# Patient Record
Sex: Female | Born: 1935 | Hispanic: No | Smoking: Never smoker
Health system: Southern US, Community
[De-identification: ages and names within clinical notes are randomized; demographics above are authoritative.]

## PROBLEM LIST (undated history)

## (undated) DIAGNOSIS — I10 Essential (primary) hypertension: Secondary | ICD-10-CM

## (undated) DIAGNOSIS — C189 Malignant neoplasm of colon, unspecified: Secondary | ICD-10-CM

## (undated) DIAGNOSIS — R55 Syncope and collapse: Secondary | ICD-10-CM

## (undated) DIAGNOSIS — D649 Anemia, unspecified: Secondary | ICD-10-CM

## (undated) DIAGNOSIS — E785 Hyperlipidemia, unspecified: Secondary | ICD-10-CM

## (undated) DIAGNOSIS — K449 Diaphragmatic hernia without obstruction or gangrene: Secondary | ICD-10-CM

## (undated) DIAGNOSIS — E079 Disorder of thyroid, unspecified: Secondary | ICD-10-CM

## (undated) DIAGNOSIS — K219 Gastro-esophageal reflux disease without esophagitis: Secondary | ICD-10-CM

## (undated) HISTORY — PX: COLON SURGERY: SHX602

## (undated) HISTORY — DX: Syncope and collapse: R55

---

## 1998-05-14 ENCOUNTER — Other Ambulatory Visit: Admission: RE | Admit: 1998-05-14 | Discharge: 1998-05-14 | Payer: Self-pay | Admitting: Internal Medicine

## 2000-04-12 ENCOUNTER — Other Ambulatory Visit: Admission: RE | Admit: 2000-04-12 | Discharge: 2000-04-12 | Payer: Self-pay | Admitting: Obstetrics and Gynecology

## 2001-05-08 ENCOUNTER — Other Ambulatory Visit: Admission: RE | Admit: 2001-05-08 | Discharge: 2001-05-08 | Payer: Self-pay | Admitting: Obstetrics and Gynecology

## 2004-08-26 ENCOUNTER — Ambulatory Visit: Payer: Self-pay | Admitting: Internal Medicine

## 2004-08-31 ENCOUNTER — Ambulatory Visit: Payer: Self-pay | Admitting: Internal Medicine

## 2006-08-29 ENCOUNTER — Ambulatory Visit: Payer: Self-pay | Admitting: Internal Medicine

## 2006-08-31 LAB — CBC WITH DIFFERENTIAL/PLATELET
Basophils Absolute: 0.1 10*3/uL (ref 0.0–0.1)
EOS%: 2.2 % (ref 0.0–7.0)
Eosinophils Absolute: 0.1 10*3/uL (ref 0.0–0.5)
HCT: 26.4 % — ABNORMAL LOW (ref 34.8–46.6)
HGB: 8.3 g/dL — ABNORMAL LOW (ref 11.6–15.9)
MCH: 21 pg — ABNORMAL LOW (ref 26.0–34.0)
MCV: 67.3 fL — ABNORMAL LOW (ref 81.0–101.0)
MONO%: 11.8 % (ref 0.0–13.0)
NEUT#: 3.5 10*3/uL (ref 1.5–6.5)
NEUT%: 61.4 % (ref 39.6–76.8)
RDW: 22.5 % — ABNORMAL HIGH (ref 11.3–14.5)

## 2006-09-01 LAB — PROTEIN ELECTROPHORESIS, SERUM
Gamma Globulin: 12.8 % (ref 11.1–18.8)
Total Protein, Serum Electrophoresis: 7.6 g/dL (ref 6.0–8.3)

## 2006-09-01 LAB — COMPREHENSIVE METABOLIC PANEL
AST: 17 U/L (ref 0–37)
Albumin: 4.3 g/dL (ref 3.5–5.2)
Alkaline Phosphatase: 86 U/L (ref 39–117)
BUN: 24 mg/dL — ABNORMAL HIGH (ref 6–23)
Calcium: 9.6 mg/dL (ref 8.4–10.5)
Chloride: 100 mEq/L (ref 96–112)
Creatinine, Ser: 1.16 mg/dL (ref 0.40–1.20)
Glucose, Bld: 96 mg/dL (ref 70–99)
Potassium: 4.2 mEq/L (ref 3.5–5.3)

## 2006-09-01 LAB — IRON AND TIBC
%SAT: 4 % — ABNORMAL LOW (ref 20–55)
TIBC: 552 ug/dL — ABNORMAL HIGH (ref 250–470)
UIBC: 530 ug/dL

## 2006-09-01 LAB — FOLATE RBC: RBC Folate: 1209 ng/mL — ABNORMAL HIGH (ref 180–600)

## 2006-09-01 LAB — FERRITIN: Ferritin: 3 ng/mL — ABNORMAL LOW (ref 10–291)

## 2006-09-01 LAB — VITAMIN B12: Vitamin B-12: 548 pg/mL (ref 211–911)

## 2006-10-16 ENCOUNTER — Ambulatory Visit: Payer: Self-pay | Admitting: Internal Medicine

## 2006-10-19 LAB — CBC WITH DIFFERENTIAL/PLATELET
BASO%: 2.1 % — ABNORMAL HIGH (ref 0.0–2.0)
EOS%: 2.1 % (ref 0.0–7.0)
HCT: 36 % (ref 34.8–46.6)
LYMPH%: 28.7 % (ref 14.0–48.0)
MCH: 27.9 pg (ref 26.0–34.0)
MCHC: 33.7 g/dL (ref 32.0–36.0)
MONO#: 0.4 10*3/uL (ref 0.1–0.9)
NEUT%: 60.2 % (ref 39.6–76.8)
RBC: 4.35 10*6/uL (ref 3.70–5.32)
WBC: 5.2 10*3/uL (ref 3.9–10.0)
lymph#: 1.5 10*3/uL (ref 0.9–3.3)

## 2006-10-19 LAB — FERRITIN: Ferritin: 387 ng/mL — ABNORMAL HIGH (ref 10–291)

## 2006-10-19 LAB — IRON AND TIBC
%SAT: 22 % (ref 20–55)
Iron: 71 ug/dL (ref 42–145)
TIBC: 323 ug/dL (ref 250–470)

## 2007-01-16 ENCOUNTER — Ambulatory Visit: Payer: Self-pay | Admitting: Internal Medicine

## 2007-01-18 LAB — CBC WITH DIFFERENTIAL/PLATELET
BASO%: 1 % (ref 0.0–2.0)
Basophils Absolute: 0.1 10*3/uL (ref 0.0–0.1)
HCT: 36 % (ref 34.8–46.6)
HGB: 12.4 g/dL (ref 11.6–15.9)
MONO#: 0.5 10*3/uL (ref 0.1–0.9)
NEUT%: 58.7 % (ref 39.6–76.8)
RDW: 13.6 % (ref 11.3–14.5)
WBC: 5.3 10*3/uL (ref 3.9–10.0)
lymph#: 1.5 10*3/uL (ref 0.9–3.3)

## 2007-01-18 LAB — IRON AND TIBC
%SAT: 27 % (ref 20–55)
TIBC: 303 ug/dL (ref 250–470)
UIBC: 221 ug/dL

## 2009-08-14 ENCOUNTER — Encounter (INDEPENDENT_AMBULATORY_CARE_PROVIDER_SITE_OTHER): Payer: Self-pay | Admitting: *Deleted

## 2010-01-14 ENCOUNTER — Encounter (INDEPENDENT_AMBULATORY_CARE_PROVIDER_SITE_OTHER): Payer: Self-pay | Admitting: *Deleted

## 2010-03-29 ENCOUNTER — Encounter (INDEPENDENT_AMBULATORY_CARE_PROVIDER_SITE_OTHER): Payer: Self-pay | Admitting: *Deleted

## 2010-03-31 ENCOUNTER — Ambulatory Visit: Payer: Self-pay | Admitting: Internal Medicine

## 2010-04-12 ENCOUNTER — Ambulatory Visit: Payer: Self-pay | Admitting: Internal Medicine

## 2010-08-17 NOTE — Letter (Signed)
Summary: [facility] Instructions  [facility]  [address] [place], [place] [address]   Phone: [phone]  Fax: [phone]       Allison Knight    [birth year]    MRN: [id number]       Procedure Day /Date: Monday 04-12-10     Arrival Time:  8:30 a.m.     Procedure Time: 9:30 a.m.     Location of Procedure:                    _x _   [facility] Center (4th Floor)  PREPARATION FOR COLONOSCOPY WITH MIRALAX  Starting 5 days prior to your procedure  04-07-10 do not eat nuts, seeds, popcorn, corn, beans, peas,  salads, or any raw vegetables.  Do not take any fiber supplements (e.g. Metamucil, Citrucel, and Benefiber). ____________________________________________________________________________________________________   THE DAY BEFORE YOUR PROCEDURE         DATE:  04-11-10  DAY:  Sunday  1   Drink clear liquids the entire day-NO SOLID FOOD  2   Do not drink anything colored red or purple.  Avoid juices with pulp.  No orange juice.  3   Drink at least 64 oz. (8 glasses) of fluid/clear liquids during the day to prevent dehydration and help the prep work efficiently.  CLEAR LIQUIDS INCLUDE: Water Jello Ice Popsicles Tea (sugar ok, no milk/cream) Powdered fruit flavored drinks Coffee (sugar ok, no milk/cream) Gatorade Juice: apple, white grape, white cranberry  Lemonade Clear bullion, consomm, broth Carbonated beverages (any kind) Strained chicken noodle soup Hard Candy  4   Mix the entire bottle of Miralax with 64 oz. of Gatorade/Powerade in the morning and put in the refrigerator to chill.  5   At 3:00 pm take 2 Dulcolax/Bisacodyl tablets.  6   At 4:30 pm take one Reglan/Metoclopramide tablet.  7  Starting at 5:00 pm drink one 8 oz glass of the Miralax mixture every 15-20 minutes until you have finished drinking the entire 64 oz.  You should finish drinking prep around 7:30 or 8:00 pm.  8   If you are nauseated, you may take the 2nd Reglan/Metoclopramide  tablet at 6:30 pm.        9    At 8:00 pm take 2 more DULCOLAX/Bisacodyl tablets.     THE DAY OF YOUR PROCEDURE      DATE:   04-12-10  DAY:  Monday  You may drink clear liquids until  7:30 a.m.  (2 HOURS BEFORE PROCEDURE).   MEDICATION INSTRUCTIONS  Unless otherwise instructed, you should take regular prescription medications with a small sip of water as early as possible the morning of your procedure.          OTHER INSTRUCTIONS  You will need a responsible adult at least 73 years of age to accompany you and drive you home.   This person must remain in the waiting room during your procedure.  Wear loose fitting clothing that is easily removed.  Leave jewelry and other valuables at home.  However, you may wish to bring a book to read or an iPod/MP3 player to listen to music as you wait for your procedure to start.  Remove all body piercing jewelry and leave at home.  Total time from sign-in until discharge is approximately 2-3 hours.  You should go home directly after your procedure and rest.  You can resume normal activities the day after your procedure.  The day of your procedure you  should not:   Drive   Make legal decisions   Operate machinery   Drink alcohol   Return to work  You will receive specific instructions about eating, activities and medications before you leave.   The above instructions have been reviewed and explained to me by   Durwin Glaze RN  March 31, 2010 10:28 AM    I fully understand and can verbalize these instructions _____________________________ Date _______

## 2010-08-17 NOTE — Miscellaneous (Signed)
Summary: [facility]  Clinical Lists Changes  Medications: Added new medication of MIRALAX   POWD (POLYETHYLENE GLYCOL 3350) As per prep  instructions. - Signed Added new medication of DULCOLAX 5 MG  TBEC (BISACODYL) Day before procedure take 2 at 3pm and 2 at 8pm. - Signed Added new medication of REGLAN 10 MG  TABS (METOCLOPRAMIDE HCL) As per prep instructions. - Signed Rx of MIRALAX   POWD (POLYETHYLENE GLYCOL 3350) As per prep  instructions.;  #255gm x 0;  Signed;  Entered by: Durwin Glaze RN;  Authorized by: Hart Carwin MD;  Method used: Electronically to [facility] [phone]*, [address] [place], [place]  [address], Ph: [phone], Fax: [phone] Rx of DULCOLAX 5 MG  TBEC (BISACODYL) Day before procedure take 2 at 3pm and 2 at 8pm.;  #4 x 0;  Signed;  Entered by: Durwin Glaze RN;  Authorized by: Hart Carwin MD;  Method used: Electronically to [facility] [phone]*, [address], [place], [place]  [address], Ph: [phone], Fax: [phone] Rx of REGLAN 10 MG  TABS (METOCLOPRAMIDE HCL) As per prep instructions.;  #2 x 0;  Signed;  Entered by: Durwin Glaze RN;  Authorized by: Hart Carwin MD;  Method used: Electronically to [facility] [phone]*, [address], [place], [place]  [address], Ph: [phone], Fax: [phone] Observations: Added new observation of NKA: T (03/31/2010 9:45)    Prescriptions: REGLAN 10 MG  TABS (METOCLOPRAMIDE HCL) As per prep instructions.  #2 x 0   Entered by:   Durwin Glaze RN   Authorized by:   Hart Carwin MD   Signed by:   Durwin Glaze RN on 03/31/2010   Method used:   Electronically to        [facility] [phone]* (retail)       [address]       [place], [place]  [address]       Ph: [phone]       Fax: [phone]   RxID:   [id number] DULCOLAX 5 MG  TBEC (BISACODYL) Day before procedure take 2 at 3pm and 2 at 8pm.  #4 x 0   Entered by:   Durwin Glaze RN   Authorized by:   Hart Carwin MD   Signed by:   Durwin Glaze RN on 03/31/2010  Method used:   Electronically to        [facility] [phone]* (retail)       [address]       [place], [place]  [address]       Ph: [phone]       Fax: [phone]   RxID:   [id number] MIRALAX   POWD (POLYETHYLENE GLYCOL 3350) As per prep  instructions.  #255gm x 0   Entered by:   Durwin Glaze RN   Authorized by:   Hart Carwin MD   Signed by:   Durwin Glaze RN on 03/31/2010   Method used:   Electronically to        [facility] [phone]* (retail)       [address]       [place], [place]  [address]       Ph: [phone]       Fax: [phone]   RxID:   [id number]

## 2010-08-17 NOTE — Letter (Signed)
Summary: Previsit letter  [facility]  [address] [place], [place] [address]   Phone: [phone]  Fax: [phone]       01/14/2010 MRN: [id number]  [facility] [address] [place], [place]  [address]  Dear Ms. Allison Knight,  Welcome to the Gastroenterology Division at [facility].    You are scheduled to see a nurse for your pre-procedure visit on 03-31-10 at 10:00a.m. on the 3rd floor at [facility], [address]  We ask that you try to arrive at our office 15 minutes prior to your appointment time to allow for check-in.  Your nurse visit will consist of discussing your medical and surgical history, your immediate family medical history, and your medications.    Please bring a complete list of all your medications or, if you prefer, bring the medication bottles and we will list them.  We will need to be aware of both prescribed and over the counter drugs.  We will need to know exact dosage information as well.  If you are on blood thinners (Coumadin, Plavix, Aggrenox, Ticlid, etc.) please call our office today/prior to your appointment, as we need to consult with your physician about holding your medication.   Please be prepared to read and sign documents such as consent forms, a financial agreement, and acknowledgement forms.  If necessary, and with your consent, a friend or relative is welcome to sit-in on the nurse visit with you.  Please bring your insurance card so that we may make a copy of it.  If your insurance requires a referral to see a specialist, please bring your referral form from your primary care physician.  No co-pay is required for this nurse visit.     If you cannot keep your appointment, please call [phone] to cancel or reschedule prior to your appointment date.  This allows [place] the opportunity to schedule an appointment for another patient in need of care.    Thank you for choosing [facility] for your medical  needs.  We appreciate the opportunity to care for you.  Please visit [place] at our website  to learn more about our practice.                     Sincerely.                                                                                                                   The Gastroenterology Division

## 2010-08-17 NOTE — Letter (Signed)
Summary: Colonoscopy Letter   Gastroenterology  [address] [place], [place] [address]   Phone: [phone]  Fax: [phone]      August 14, 2009 MRN: [id number]   CHLOIE LONEY [address] [place], [place]  [address]   Dear Ms. Donnie Aho,   According to your medical record, it is time for you to schedule a Colonoscopy. The American Cancer Society recommends this procedure as a method to detect early colon cancer. Patients with a family history of colon cancer, or a personal history of colon polyps or inflammatory bowel disease are at increased risk.  This letter has beeen generated based on the recommendations made at the time of your procedure. If you feel that in your particular situation this may no longer apply, please contact our office.  Please call our office at [phone] to schedule this appointment or to update your records at your earliest convenience.  Thank you for cooperating with [place] to provide you with the very best care possible.   Sincerely,  Hedwig Morton. Juanda Chance, M.D.  [facility] Gastroenterology Division [phone]

## 2010-08-17 NOTE — Procedures (Signed)
Summary: Colonoscopy  Patient: Allison Knight Note: All result statuses are Final unless otherwise noted.  Tests: (1) Colonoscopy (COL)   COL Colonoscopy           DONE     [facility]     [address]     [place], [place]  [address]           COLONOSCOPY PROCEDURE REPORT           PATIENT:  Allison Knight, Allison Knight  MR#:  [id number]     BIRTHDATE:  [birth year], 74 yrs. old  GENDER:  female     ENDOSCOPIST:  Hedwig Morton. Juanda Chance, MD     REF. BY:  Elias Else, M.D.     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  Digital rectal exam was performed and     revealed no rectal masses.   The LB [id number]-[id number] endoscope     was introduced through the anus and advanced to the cecum, which     was identified by both the appendix and ileocecal valve, without     limitations.  The quality of the prep was good, using MiraLax.     The instrument was then slowly withdrawn as the colon was fully     examined.     <<PROCEDUREIMAGES>>           FINDINGS:  A postoperative change was noted (see image7). s/p     anterior resection, anastomosis in the sigmoid colon at 10 cm,     wide open, no stricture  Mild diverticulosis was found in the     sigmoid colon (see image5).  other finding (see image1, image2,     image3, image4, and image6).   Retroflexed views in the rectum     revealed no abnormalities.    The scope was then withdrawn from     the patient and the procedure completed.           COMPLICATIONS:  None     ENDOSCOPIC IMPRESSION:     1) Postop change     2) Mild diverticulosis in the sigmoid colon     3)  Other finding     s/p anterior resection for DukeIIB colon cancer in 1991, no     evidence of recurrent ca     RECOMMENDATIONS:     1) high fiber diet     REPEAT EXAM:  In 5 year(s) for.           ______________________________     Hedwig Morton. Juanda Chance, MD           CC:           n.     eSIGNED:   Hedwig Morton. Brodie at 04/12/2010 10:20 AM           Mickie Hillier, [id number]  Note: An exclamation mark (!) indicates a result that was not dispersed into the flowsheet. Document Creation Date:  04/12/2010 10:21 AM _______________________________________________________________________  (1) Order result status: Final Collection or observation date-time: 04/12/2010 10:11 Requested date-time:  Receipt date-time:  Reported date-time:  Referring Physician:   Ordering Physician: Lina Sar [phone]) Specimen Source:  Source: Launa Grill Order Number: [id number] Lab site:   Appended Document: Colonoscopy    Clinical Lists Changes  Observations: Added new observation of COLONNXTDUE: 03/2015 (04/12/2010 12:51)

## 2011-07-20 DIAGNOSIS — J309 Allergic rhinitis, unspecified: Secondary | ICD-10-CM | POA: Diagnosis not present

## 2011-08-01 DIAGNOSIS — T63461A Toxic effect of venom of wasps, accidental (unintentional), initial encounter: Secondary | ICD-10-CM | POA: Diagnosis not present

## 2011-08-01 DIAGNOSIS — T6391XA Toxic effect of contact with unspecified venomous animal, accidental (unintentional), initial encounter: Secondary | ICD-10-CM | POA: Diagnosis not present

## 2011-08-15 DIAGNOSIS — J309 Allergic rhinitis, unspecified: Secondary | ICD-10-CM | POA: Diagnosis not present

## 2011-09-02 DIAGNOSIS — J309 Allergic rhinitis, unspecified: Secondary | ICD-10-CM | POA: Diagnosis not present

## 2011-09-16 DIAGNOSIS — J3089 Other allergic rhinitis: Secondary | ICD-10-CM | POA: Diagnosis not present

## 2011-09-16 DIAGNOSIS — J309 Allergic rhinitis, unspecified: Secondary | ICD-10-CM | POA: Diagnosis not present

## 2011-10-20 DIAGNOSIS — J309 Allergic rhinitis, unspecified: Secondary | ICD-10-CM | POA: Diagnosis not present

## 2011-11-03 DIAGNOSIS — J309 Allergic rhinitis, unspecified: Secondary | ICD-10-CM | POA: Diagnosis not present

## 2011-11-21 DIAGNOSIS — I10 Essential (primary) hypertension: Secondary | ICD-10-CM | POA: Diagnosis not present

## 2011-11-21 DIAGNOSIS — E785 Hyperlipidemia, unspecified: Secondary | ICD-10-CM | POA: Diagnosis not present

## 2011-11-21 DIAGNOSIS — K219 Gastro-esophageal reflux disease without esophagitis: Secondary | ICD-10-CM | POA: Diagnosis not present

## 2011-11-21 DIAGNOSIS — D509 Iron deficiency anemia, unspecified: Secondary | ICD-10-CM | POA: Diagnosis not present

## 2011-12-05 DIAGNOSIS — J309 Allergic rhinitis, unspecified: Secondary | ICD-10-CM | POA: Diagnosis not present

## 2011-12-30 DIAGNOSIS — J309 Allergic rhinitis, unspecified: Secondary | ICD-10-CM | POA: Diagnosis not present

## 2012-01-16 DIAGNOSIS — IMO0002 Reserved for concepts with insufficient information to code with codable children: Secondary | ICD-10-CM | POA: Diagnosis not present

## 2012-02-09 DIAGNOSIS — J309 Allergic rhinitis, unspecified: Secondary | ICD-10-CM | POA: Diagnosis not present

## 2012-03-09 DIAGNOSIS — J309 Allergic rhinitis, unspecified: Secondary | ICD-10-CM | POA: Diagnosis not present

## 2012-04-11 DIAGNOSIS — J309 Allergic rhinitis, unspecified: Secondary | ICD-10-CM | POA: Diagnosis not present

## 2012-05-07 DIAGNOSIS — T6391XA Toxic effect of contact with unspecified venomous animal, accidental (unintentional), initial encounter: Secondary | ICD-10-CM | POA: Diagnosis not present

## 2012-05-07 DIAGNOSIS — T63461A Toxic effect of venom of wasps, accidental (unintentional), initial encounter: Secondary | ICD-10-CM | POA: Diagnosis not present

## 2012-05-07 DIAGNOSIS — J309 Allergic rhinitis, unspecified: Secondary | ICD-10-CM | POA: Diagnosis not present

## 2012-05-10 DIAGNOSIS — D649 Anemia, unspecified: Secondary | ICD-10-CM | POA: Diagnosis not present

## 2012-05-10 DIAGNOSIS — N183 Chronic kidney disease, stage 3 unspecified: Secondary | ICD-10-CM | POA: Diagnosis not present

## 2012-05-11 DIAGNOSIS — Z23 Encounter for immunization: Secondary | ICD-10-CM | POA: Diagnosis not present

## 2012-05-25 ENCOUNTER — Other Ambulatory Visit (HOSPITAL_COMMUNITY): Payer: Self-pay | Admitting: *Deleted

## 2012-05-28 ENCOUNTER — Encounter (HOSPITAL_COMMUNITY)
Admission: RE | Admit: 2012-05-28 | Discharge: 2012-05-28 | Disposition: A | Discharge status: Home / Self Care | Payer: Medicare Other | Source: Ambulatory Visit | Attending: Nephrology | Admitting: Nephrology

## 2012-05-28 DIAGNOSIS — D509 Iron deficiency anemia, unspecified: Secondary | ICD-10-CM | POA: Insufficient documentation

## 2012-05-28 MED ORDER — FERUMOXYTOL INJECTION 510 MG/17 ML
INTRAVENOUS | Status: AC
  Administered 2012-05-28: 510 mg via INTRAVENOUS
  Filled 2012-05-28: qty 17

## 2012-05-28 MED ORDER — SODIUM CHLORIDE 0.9 % IV SOLN
INTRAVENOUS | Status: DC
  Administered 2012-05-28: 250 mL via INTRAVENOUS

## 2012-05-28 MED ORDER — FERUMOXYTOL INJECTION 510 MG/17 ML
510.0000 mg | INTRAVENOUS | Status: DC
  Administered 2012-05-28: 510 mg via INTRAVENOUS

## 2012-05-31 DIAGNOSIS — J309 Allergic rhinitis, unspecified: Secondary | ICD-10-CM | POA: Diagnosis not present

## 2012-06-04 ENCOUNTER — Encounter (HOSPITAL_COMMUNITY)
Admission: RE | Admit: 2012-06-04 | Discharge: 2012-06-04 | Disposition: A | Discharge status: Home / Self Care | Payer: Medicare Other | Source: Ambulatory Visit | Attending: Nephrology | Admitting: Nephrology

## 2012-06-04 MED ORDER — FERUMOXYTOL INJECTION 510 MG/17 ML
510.0000 mg | INTRAVENOUS | Status: AC
  Administered 2012-06-04: 510 mg via INTRAVENOUS

## 2012-06-04 MED ORDER — FERUMOXYTOL INJECTION 510 MG/17 ML
INTRAVENOUS | Status: AC
  Administered 2012-06-04: 510 mg via INTRAVENOUS
  Filled 2012-06-04: qty 17

## 2012-06-04 MED ORDER — SODIUM CHLORIDE 0.9 % IV SOLN
INTRAVENOUS | Status: AC
  Administered 2012-06-04: 250 mL via INTRAVENOUS

## 2012-06-08 DIAGNOSIS — J309 Allergic rhinitis, unspecified: Secondary | ICD-10-CM | POA: Diagnosis not present

## 2012-06-20 DIAGNOSIS — I10 Essential (primary) hypertension: Secondary | ICD-10-CM | POA: Diagnosis not present

## 2012-06-20 DIAGNOSIS — N183 Chronic kidney disease, stage 3 unspecified: Secondary | ICD-10-CM | POA: Diagnosis not present

## 2012-06-20 DIAGNOSIS — E785 Hyperlipidemia, unspecified: Secondary | ICD-10-CM | POA: Diagnosis not present

## 2012-06-20 DIAGNOSIS — K219 Gastro-esophageal reflux disease without esophagitis: Secondary | ICD-10-CM | POA: Diagnosis not present

## 2012-06-20 DIAGNOSIS — D509 Iron deficiency anemia, unspecified: Secondary | ICD-10-CM | POA: Diagnosis not present

## 2012-06-20 DIAGNOSIS — Z0000 Encounter for general adult medical examination without abnormal findings: Secondary | ICD-10-CM | POA: Diagnosis not present

## 2012-06-22 DIAGNOSIS — N390 Urinary tract infection, site not specified: Secondary | ICD-10-CM | POA: Diagnosis not present

## 2012-06-26 DIAGNOSIS — E871 Hypo-osmolality and hyponatremia: Secondary | ICD-10-CM | POA: Diagnosis not present

## 2012-06-26 DIAGNOSIS — D509 Iron deficiency anemia, unspecified: Secondary | ICD-10-CM | POA: Diagnosis not present

## 2012-07-09 DIAGNOSIS — J309 Allergic rhinitis, unspecified: Secondary | ICD-10-CM | POA: Diagnosis not present

## 2012-08-17 DIAGNOSIS — T63461A Toxic effect of venom of wasps, accidental (unintentional), initial encounter: Secondary | ICD-10-CM | POA: Diagnosis not present

## 2012-08-17 DIAGNOSIS — J309 Allergic rhinitis, unspecified: Secondary | ICD-10-CM | POA: Diagnosis not present

## 2012-08-17 DIAGNOSIS — T6391XA Toxic effect of contact with unspecified venomous animal, accidental (unintentional), initial encounter: Secondary | ICD-10-CM | POA: Diagnosis not present

## 2012-09-07 DIAGNOSIS — T63461A Toxic effect of venom of wasps, accidental (unintentional), initial encounter: Secondary | ICD-10-CM | POA: Diagnosis not present

## 2012-09-07 DIAGNOSIS — J309 Allergic rhinitis, unspecified: Secondary | ICD-10-CM | POA: Diagnosis not present

## 2012-09-07 DIAGNOSIS — T6391XA Toxic effect of contact with unspecified venomous animal, accidental (unintentional), initial encounter: Secondary | ICD-10-CM | POA: Diagnosis not present

## 2012-09-26 DIAGNOSIS — J309 Allergic rhinitis, unspecified: Secondary | ICD-10-CM | POA: Diagnosis not present

## 2012-09-28 DIAGNOSIS — J309 Allergic rhinitis, unspecified: Secondary | ICD-10-CM | POA: Diagnosis not present

## 2012-10-04 DIAGNOSIS — J3089 Other allergic rhinitis: Secondary | ICD-10-CM | POA: Diagnosis not present

## 2012-10-04 DIAGNOSIS — J301 Allergic rhinitis due to pollen: Secondary | ICD-10-CM | POA: Diagnosis not present

## 2012-11-06 DIAGNOSIS — J309 Allergic rhinitis, unspecified: Secondary | ICD-10-CM | POA: Diagnosis not present

## 2012-11-14 DIAGNOSIS — Z1231 Encounter for screening mammogram for malignant neoplasm of breast: Secondary | ICD-10-CM | POA: Diagnosis not present

## 2012-12-14 DIAGNOSIS — J309 Allergic rhinitis, unspecified: Secondary | ICD-10-CM | POA: Diagnosis not present

## 2012-12-17 DIAGNOSIS — N951 Menopausal and female climacteric states: Secondary | ICD-10-CM | POA: Diagnosis not present

## 2012-12-17 DIAGNOSIS — N183 Chronic kidney disease, stage 3 unspecified: Secondary | ICD-10-CM | POA: Diagnosis not present

## 2012-12-17 DIAGNOSIS — Z23 Encounter for immunization: Secondary | ICD-10-CM | POA: Diagnosis not present

## 2012-12-17 DIAGNOSIS — I10 Essential (primary) hypertension: Secondary | ICD-10-CM | POA: Diagnosis not present

## 2012-12-17 DIAGNOSIS — E039 Hypothyroidism, unspecified: Secondary | ICD-10-CM | POA: Diagnosis not present

## 2012-12-17 DIAGNOSIS — E785 Hyperlipidemia, unspecified: Secondary | ICD-10-CM | POA: Diagnosis not present

## 2012-12-17 DIAGNOSIS — K219 Gastro-esophageal reflux disease without esophagitis: Secondary | ICD-10-CM | POA: Diagnosis not present

## 2013-01-15 DIAGNOSIS — J309 Allergic rhinitis, unspecified: Secondary | ICD-10-CM | POA: Diagnosis not present

## 2013-01-15 DIAGNOSIS — T6391XA Toxic effect of contact with unspecified venomous animal, accidental (unintentional), initial encounter: Secondary | ICD-10-CM | POA: Diagnosis not present

## 2013-01-30 DIAGNOSIS — M899 Disorder of bone, unspecified: Secondary | ICD-10-CM | POA: Diagnosis not present

## 2013-01-30 DIAGNOSIS — N951 Menopausal and female climacteric states: Secondary | ICD-10-CM | POA: Diagnosis not present

## 2013-02-18 DIAGNOSIS — Z961 Presence of intraocular lens: Secondary | ICD-10-CM | POA: Diagnosis not present

## 2013-02-18 DIAGNOSIS — H2640 Unspecified secondary cataract: Secondary | ICD-10-CM | POA: Diagnosis not present

## 2013-02-18 DIAGNOSIS — H43819 Vitreous degeneration, unspecified eye: Secondary | ICD-10-CM | POA: Diagnosis not present

## 2013-02-18 DIAGNOSIS — H01009 Unspecified blepharitis unspecified eye, unspecified eyelid: Secondary | ICD-10-CM | POA: Diagnosis not present

## 2013-02-18 DIAGNOSIS — J309 Allergic rhinitis, unspecified: Secondary | ICD-10-CM | POA: Diagnosis not present

## 2013-03-25 DIAGNOSIS — J309 Allergic rhinitis, unspecified: Secondary | ICD-10-CM | POA: Diagnosis not present

## 2013-04-09 DIAGNOSIS — Z23 Encounter for immunization: Secondary | ICD-10-CM | POA: Diagnosis not present

## 2013-04-29 DIAGNOSIS — J309 Allergic rhinitis, unspecified: Secondary | ICD-10-CM | POA: Diagnosis not present

## 2013-04-29 DIAGNOSIS — T63461A Toxic effect of venom of wasps, accidental (unintentional), initial encounter: Secondary | ICD-10-CM | POA: Diagnosis not present

## 2013-04-29 DIAGNOSIS — T6391XA Toxic effect of contact with unspecified venomous animal, accidental (unintentional), initial encounter: Secondary | ICD-10-CM | POA: Diagnosis not present

## 2013-05-31 DIAGNOSIS — J309 Allergic rhinitis, unspecified: Secondary | ICD-10-CM | POA: Diagnosis not present

## 2013-06-24 DIAGNOSIS — I10 Essential (primary) hypertension: Secondary | ICD-10-CM | POA: Diagnosis not present

## 2013-06-24 DIAGNOSIS — Z0000 Encounter for general adult medical examination without abnormal findings: Secondary | ICD-10-CM | POA: Diagnosis not present

## 2013-06-24 DIAGNOSIS — E785 Hyperlipidemia, unspecified: Secondary | ICD-10-CM | POA: Diagnosis not present

## 2013-06-24 DIAGNOSIS — K219 Gastro-esophageal reflux disease without esophagitis: Secondary | ICD-10-CM | POA: Diagnosis not present

## 2013-06-24 DIAGNOSIS — Z1331 Encounter for screening for depression: Secondary | ICD-10-CM | POA: Diagnosis not present

## 2013-06-24 DIAGNOSIS — N183 Chronic kidney disease, stage 3 unspecified: Secondary | ICD-10-CM | POA: Diagnosis not present

## 2013-06-24 DIAGNOSIS — E039 Hypothyroidism, unspecified: Secondary | ICD-10-CM | POA: Diagnosis not present

## 2013-06-24 DIAGNOSIS — D509 Iron deficiency anemia, unspecified: Secondary | ICD-10-CM | POA: Diagnosis not present

## 2013-07-01 DIAGNOSIS — J309 Allergic rhinitis, unspecified: Secondary | ICD-10-CM | POA: Diagnosis not present

## 2013-08-01 DIAGNOSIS — T63461A Toxic effect of venom of wasps, accidental (unintentional), initial encounter: Secondary | ICD-10-CM | POA: Diagnosis not present

## 2013-08-01 DIAGNOSIS — J309 Allergic rhinitis, unspecified: Secondary | ICD-10-CM | POA: Diagnosis not present

## 2013-08-01 DIAGNOSIS — T6391XA Toxic effect of contact with unspecified venomous animal, accidental (unintentional), initial encounter: Secondary | ICD-10-CM | POA: Diagnosis not present

## 2013-08-22 DIAGNOSIS — J309 Allergic rhinitis, unspecified: Secondary | ICD-10-CM | POA: Diagnosis not present

## 2013-08-30 DIAGNOSIS — J309 Allergic rhinitis, unspecified: Secondary | ICD-10-CM | POA: Diagnosis not present

## 2013-08-30 DIAGNOSIS — T63461A Toxic effect of venom of wasps, accidental (unintentional), initial encounter: Secondary | ICD-10-CM | POA: Diagnosis not present

## 2013-08-30 DIAGNOSIS — T6391XA Toxic effect of contact with unspecified venomous animal, accidental (unintentional), initial encounter: Secondary | ICD-10-CM | POA: Diagnosis not present

## 2013-10-02 DIAGNOSIS — J309 Allergic rhinitis, unspecified: Secondary | ICD-10-CM | POA: Diagnosis not present

## 2013-10-02 DIAGNOSIS — T63461A Toxic effect of venom of wasps, accidental (unintentional), initial encounter: Secondary | ICD-10-CM | POA: Diagnosis not present

## 2013-10-02 DIAGNOSIS — T6391XA Toxic effect of contact with unspecified venomous animal, accidental (unintentional), initial encounter: Secondary | ICD-10-CM | POA: Diagnosis not present

## 2013-10-04 DIAGNOSIS — J309 Allergic rhinitis, unspecified: Secondary | ICD-10-CM | POA: Diagnosis not present

## 2013-10-07 DIAGNOSIS — J309 Allergic rhinitis, unspecified: Secondary | ICD-10-CM | POA: Diagnosis not present

## 2013-10-10 DIAGNOSIS — J309 Allergic rhinitis, unspecified: Secondary | ICD-10-CM | POA: Diagnosis not present

## 2013-10-14 DIAGNOSIS — J309 Allergic rhinitis, unspecified: Secondary | ICD-10-CM | POA: Diagnosis not present

## 2013-10-16 DIAGNOSIS — J309 Allergic rhinitis, unspecified: Secondary | ICD-10-CM | POA: Diagnosis not present

## 2013-10-22 DIAGNOSIS — J309 Allergic rhinitis, unspecified: Secondary | ICD-10-CM | POA: Diagnosis not present

## 2013-10-22 DIAGNOSIS — H1045 Other chronic allergic conjunctivitis: Secondary | ICD-10-CM | POA: Diagnosis not present

## 2013-10-22 DIAGNOSIS — J3089 Other allergic rhinitis: Secondary | ICD-10-CM | POA: Diagnosis not present

## 2013-11-06 DIAGNOSIS — J309 Allergic rhinitis, unspecified: Secondary | ICD-10-CM | POA: Diagnosis not present

## 2013-11-28 DIAGNOSIS — J309 Allergic rhinitis, unspecified: Secondary | ICD-10-CM | POA: Diagnosis not present

## 2013-12-23 DIAGNOSIS — D509 Iron deficiency anemia, unspecified: Secondary | ICD-10-CM | POA: Diagnosis not present

## 2013-12-23 DIAGNOSIS — E785 Hyperlipidemia, unspecified: Secondary | ICD-10-CM | POA: Diagnosis not present

## 2013-12-23 DIAGNOSIS — I10 Essential (primary) hypertension: Secondary | ICD-10-CM | POA: Diagnosis not present

## 2013-12-23 DIAGNOSIS — E039 Hypothyroidism, unspecified: Secondary | ICD-10-CM | POA: Diagnosis not present

## 2013-12-23 DIAGNOSIS — K219 Gastro-esophageal reflux disease without esophagitis: Secondary | ICD-10-CM | POA: Diagnosis not present

## 2013-12-23 DIAGNOSIS — M19049 Primary osteoarthritis, unspecified hand: Secondary | ICD-10-CM | POA: Diagnosis not present

## 2013-12-23 DIAGNOSIS — N183 Chronic kidney disease, stage 3 unspecified: Secondary | ICD-10-CM | POA: Diagnosis not present

## 2013-12-27 DIAGNOSIS — T63461A Toxic effect of venom of wasps, accidental (unintentional), initial encounter: Secondary | ICD-10-CM | POA: Diagnosis not present

## 2013-12-27 DIAGNOSIS — T6391XA Toxic effect of contact with unspecified venomous animal, accidental (unintentional), initial encounter: Secondary | ICD-10-CM | POA: Diagnosis not present

## 2013-12-27 DIAGNOSIS — J309 Allergic rhinitis, unspecified: Secondary | ICD-10-CM | POA: Diagnosis not present

## 2014-01-29 DIAGNOSIS — J309 Allergic rhinitis, unspecified: Secondary | ICD-10-CM | POA: Diagnosis not present

## 2014-02-03 DIAGNOSIS — Z1231 Encounter for screening mammogram for malignant neoplasm of breast: Secondary | ICD-10-CM | POA: Diagnosis not present

## 2014-02-21 DIAGNOSIS — H01009 Unspecified blepharitis unspecified eye, unspecified eyelid: Secondary | ICD-10-CM | POA: Diagnosis not present

## 2014-02-21 DIAGNOSIS — Z961 Presence of intraocular lens: Secondary | ICD-10-CM | POA: Diagnosis not present

## 2014-02-21 DIAGNOSIS — H04129 Dry eye syndrome of unspecified lacrimal gland: Secondary | ICD-10-CM | POA: Diagnosis not present

## 2014-02-21 DIAGNOSIS — J309 Allergic rhinitis, unspecified: Secondary | ICD-10-CM | POA: Diagnosis not present

## 2014-02-21 DIAGNOSIS — H43819 Vitreous degeneration, unspecified eye: Secondary | ICD-10-CM | POA: Diagnosis not present

## 2014-03-10 ENCOUNTER — Encounter: Payer: Self-pay | Admitting: Internal Medicine

## 2014-03-14 DIAGNOSIS — E039 Hypothyroidism, unspecified: Secondary | ICD-10-CM | POA: Diagnosis not present

## 2014-03-19 DIAGNOSIS — J309 Allergic rhinitis, unspecified: Secondary | ICD-10-CM | POA: Diagnosis not present

## 2014-05-05 DIAGNOSIS — J3089 Other allergic rhinitis: Secondary | ICD-10-CM | POA: Diagnosis not present

## 2014-05-05 DIAGNOSIS — T63451D Toxic effect of venom of hornets, accidental (unintentional), subsequent encounter: Secondary | ICD-10-CM | POA: Diagnosis not present

## 2014-05-26 DIAGNOSIS — E039 Hypothyroidism, unspecified: Secondary | ICD-10-CM | POA: Diagnosis not present

## 2014-06-06 DIAGNOSIS — Z23 Encounter for immunization: Secondary | ICD-10-CM | POA: Diagnosis not present

## 2014-06-09 DIAGNOSIS — T63451D Toxic effect of venom of hornets, accidental (unintentional), subsequent encounter: Secondary | ICD-10-CM | POA: Diagnosis not present

## 2014-06-09 DIAGNOSIS — J3089 Other allergic rhinitis: Secondary | ICD-10-CM | POA: Diagnosis not present

## 2014-06-25 DIAGNOSIS — I129 Hypertensive chronic kidney disease with stage 1 through stage 4 chronic kidney disease, or unspecified chronic kidney disease: Secondary | ICD-10-CM | POA: Diagnosis not present

## 2014-06-25 DIAGNOSIS — M8580 Other specified disorders of bone density and structure, unspecified site: Secondary | ICD-10-CM | POA: Diagnosis not present

## 2014-06-25 DIAGNOSIS — Z1389 Encounter for screening for other disorder: Secondary | ICD-10-CM | POA: Diagnosis not present

## 2014-06-25 DIAGNOSIS — K219 Gastro-esophageal reflux disease without esophagitis: Secondary | ICD-10-CM | POA: Diagnosis not present

## 2014-06-25 DIAGNOSIS — J309 Allergic rhinitis, unspecified: Secondary | ICD-10-CM | POA: Diagnosis not present

## 2014-06-25 DIAGNOSIS — E780 Pure hypercholesterolemia: Secondary | ICD-10-CM | POA: Diagnosis not present

## 2014-06-25 DIAGNOSIS — N183 Chronic kidney disease, stage 3 (moderate): Secondary | ICD-10-CM | POA: Diagnosis not present

## 2014-06-25 DIAGNOSIS — Z23 Encounter for immunization: Secondary | ICD-10-CM | POA: Diagnosis not present

## 2014-06-25 DIAGNOSIS — Z0000 Encounter for general adult medical examination without abnormal findings: Secondary | ICD-10-CM | POA: Diagnosis not present

## 2014-06-25 DIAGNOSIS — E039 Hypothyroidism, unspecified: Secondary | ICD-10-CM | POA: Diagnosis not present

## 2014-07-08 DIAGNOSIS — T63451D Toxic effect of venom of hornets, accidental (unintentional), subsequent encounter: Secondary | ICD-10-CM | POA: Diagnosis not present

## 2014-07-08 DIAGNOSIS — J3089 Other allergic rhinitis: Secondary | ICD-10-CM | POA: Diagnosis not present

## 2014-08-04 DIAGNOSIS — E039 Hypothyroidism, unspecified: Secondary | ICD-10-CM | POA: Diagnosis not present

## 2014-08-22 ENCOUNTER — Ambulatory Visit
Admission: RE | Admit: 2014-08-22 | Discharge: 2014-08-22 | Disposition: A | Discharge status: Home / Self Care | Payer: Medicare Other | Source: Ambulatory Visit | Attending: Allergy | Admitting: Allergy

## 2014-08-22 ENCOUNTER — Other Ambulatory Visit: Payer: Self-pay | Admitting: Allergy

## 2014-08-22 DIAGNOSIS — R0602 Shortness of breath: Secondary | ICD-10-CM

## 2014-08-22 DIAGNOSIS — H1045 Other chronic allergic conjunctivitis: Secondary | ICD-10-CM | POA: Diagnosis not present

## 2014-08-22 DIAGNOSIS — R0989 Other specified symptoms and signs involving the circulatory and respiratory systems: Secondary | ICD-10-CM | POA: Diagnosis not present

## 2014-08-22 DIAGNOSIS — J3089 Other allergic rhinitis: Secondary | ICD-10-CM | POA: Diagnosis not present

## 2014-08-22 DIAGNOSIS — R05 Cough: Secondary | ICD-10-CM | POA: Diagnosis not present

## 2014-08-26 DIAGNOSIS — T63451D Toxic effect of venom of hornets, accidental (unintentional), subsequent encounter: Secondary | ICD-10-CM | POA: Diagnosis not present

## 2014-08-26 DIAGNOSIS — J3089 Other allergic rhinitis: Secondary | ICD-10-CM | POA: Diagnosis not present

## 2014-09-29 DIAGNOSIS — T63451D Toxic effect of venom of hornets, accidental (unintentional), subsequent encounter: Secondary | ICD-10-CM | POA: Diagnosis not present

## 2014-09-29 DIAGNOSIS — J3089 Other allergic rhinitis: Secondary | ICD-10-CM | POA: Diagnosis not present

## 2014-10-23 DIAGNOSIS — R0602 Shortness of breath: Secondary | ICD-10-CM | POA: Diagnosis not present

## 2014-10-23 DIAGNOSIS — K219 Gastro-esophageal reflux disease without esophagitis: Secondary | ICD-10-CM | POA: Diagnosis not present

## 2014-10-23 DIAGNOSIS — R05 Cough: Secondary | ICD-10-CM | POA: Diagnosis not present

## 2014-10-23 DIAGNOSIS — J3089 Other allergic rhinitis: Secondary | ICD-10-CM | POA: Diagnosis not present

## 2014-10-27 DIAGNOSIS — K219 Gastro-esophageal reflux disease without esophagitis: Secondary | ICD-10-CM | POA: Diagnosis not present

## 2014-10-27 DIAGNOSIS — R300 Dysuria: Secondary | ICD-10-CM | POA: Diagnosis not present

## 2014-10-27 DIAGNOSIS — R05 Cough: Secondary | ICD-10-CM | POA: Diagnosis not present

## 2014-11-07 DIAGNOSIS — T63451D Toxic effect of venom of hornets, accidental (unintentional), subsequent encounter: Secondary | ICD-10-CM | POA: Diagnosis not present

## 2014-11-10 DIAGNOSIS — J3089 Other allergic rhinitis: Secondary | ICD-10-CM | POA: Diagnosis not present

## 2014-11-12 DIAGNOSIS — J3089 Other allergic rhinitis: Secondary | ICD-10-CM | POA: Diagnosis not present

## 2014-11-12 DIAGNOSIS — R1310 Dysphagia, unspecified: Secondary | ICD-10-CM | POA: Diagnosis not present

## 2014-11-12 DIAGNOSIS — R05 Cough: Secondary | ICD-10-CM | POA: Diagnosis not present

## 2014-11-12 DIAGNOSIS — K219 Gastro-esophageal reflux disease without esophagitis: Secondary | ICD-10-CM | POA: Diagnosis not present

## 2014-11-12 DIAGNOSIS — Z85038 Personal history of other malignant neoplasm of large intestine: Secondary | ICD-10-CM | POA: Diagnosis not present

## 2014-11-20 DIAGNOSIS — J3089 Other allergic rhinitis: Secondary | ICD-10-CM | POA: Diagnosis not present

## 2014-11-24 DIAGNOSIS — J301 Allergic rhinitis due to pollen: Secondary | ICD-10-CM | POA: Diagnosis not present

## 2014-11-24 DIAGNOSIS — J3089 Other allergic rhinitis: Secondary | ICD-10-CM | POA: Diagnosis not present

## 2014-11-28 DIAGNOSIS — J3089 Other allergic rhinitis: Secondary | ICD-10-CM | POA: Diagnosis not present

## 2014-11-28 DIAGNOSIS — J301 Allergic rhinitis due to pollen: Secondary | ICD-10-CM | POA: Diagnosis not present

## 2014-12-01 DIAGNOSIS — J301 Allergic rhinitis due to pollen: Secondary | ICD-10-CM | POA: Diagnosis not present

## 2014-12-01 DIAGNOSIS — J3089 Other allergic rhinitis: Secondary | ICD-10-CM | POA: Diagnosis not present

## 2014-12-05 DIAGNOSIS — T63451D Toxic effect of venom of hornets, accidental (unintentional), subsequent encounter: Secondary | ICD-10-CM | POA: Diagnosis not present

## 2014-12-05 DIAGNOSIS — J3089 Other allergic rhinitis: Secondary | ICD-10-CM | POA: Diagnosis not present

## 2014-12-12 ENCOUNTER — Other Ambulatory Visit: Payer: Self-pay | Admitting: Gastroenterology

## 2014-12-12 DIAGNOSIS — Z86010 Personal history of colonic polyps: Secondary | ICD-10-CM | POA: Diagnosis not present

## 2014-12-12 DIAGNOSIS — K219 Gastro-esophageal reflux disease without esophagitis: Secondary | ICD-10-CM | POA: Diagnosis not present

## 2014-12-12 DIAGNOSIS — Z09 Encounter for follow-up examination after completed treatment for conditions other than malignant neoplasm: Secondary | ICD-10-CM | POA: Diagnosis not present

## 2014-12-12 DIAGNOSIS — D123 Benign neoplasm of transverse colon: Secondary | ICD-10-CM | POA: Diagnosis not present

## 2014-12-12 DIAGNOSIS — K644 Residual hemorrhoidal skin tags: Secondary | ICD-10-CM | POA: Diagnosis not present

## 2014-12-12 DIAGNOSIS — K5730 Diverticulosis of large intestine without perforation or abscess without bleeding: Secondary | ICD-10-CM | POA: Diagnosis not present

## 2014-12-12 DIAGNOSIS — D120 Benign neoplasm of cecum: Secondary | ICD-10-CM | POA: Diagnosis not present

## 2014-12-12 DIAGNOSIS — Z85038 Personal history of other malignant neoplasm of large intestine: Secondary | ICD-10-CM | POA: Diagnosis not present

## 2014-12-12 DIAGNOSIS — D126 Benign neoplasm of colon, unspecified: Secondary | ICD-10-CM | POA: Diagnosis not present

## 2014-12-12 DIAGNOSIS — R05 Cough: Secondary | ICD-10-CM | POA: Diagnosis not present

## 2014-12-12 DIAGNOSIS — K449 Diaphragmatic hernia without obstruction or gangrene: Secondary | ICD-10-CM | POA: Diagnosis not present

## 2014-12-12 DIAGNOSIS — K640 First degree hemorrhoids: Secondary | ICD-10-CM | POA: Diagnosis not present

## 2014-12-22 DIAGNOSIS — Z86010 Personal history of colonic polyps: Secondary | ICD-10-CM | POA: Diagnosis not present

## 2014-12-22 DIAGNOSIS — R05 Cough: Secondary | ICD-10-CM | POA: Diagnosis not present

## 2014-12-25 DIAGNOSIS — K219 Gastro-esophageal reflux disease without esophagitis: Secondary | ICD-10-CM | POA: Diagnosis not present

## 2014-12-25 DIAGNOSIS — D509 Iron deficiency anemia, unspecified: Secondary | ICD-10-CM | POA: Diagnosis not present

## 2014-12-25 DIAGNOSIS — I129 Hypertensive chronic kidney disease with stage 1 through stage 4 chronic kidney disease, or unspecified chronic kidney disease: Secondary | ICD-10-CM | POA: Diagnosis not present

## 2014-12-25 DIAGNOSIS — E039 Hypothyroidism, unspecified: Secondary | ICD-10-CM | POA: Diagnosis not present

## 2014-12-25 DIAGNOSIS — E780 Pure hypercholesterolemia: Secondary | ICD-10-CM | POA: Diagnosis not present

## 2014-12-25 DIAGNOSIS — Z1389 Encounter for screening for other disorder: Secondary | ICD-10-CM | POA: Diagnosis not present

## 2014-12-25 DIAGNOSIS — N183 Chronic kidney disease, stage 3 (moderate): Secondary | ICD-10-CM | POA: Diagnosis not present

## 2014-12-25 DIAGNOSIS — M8580 Other specified disorders of bone density and structure, unspecified site: Secondary | ICD-10-CM | POA: Diagnosis not present

## 2015-01-07 DIAGNOSIS — T63461D Toxic effect of venom of wasps, accidental (unintentional), subsequent encounter: Secondary | ICD-10-CM | POA: Diagnosis not present

## 2015-01-07 DIAGNOSIS — J301 Allergic rhinitis due to pollen: Secondary | ICD-10-CM | POA: Diagnosis not present

## 2015-01-26 DIAGNOSIS — M25562 Pain in left knee: Secondary | ICD-10-CM | POA: Diagnosis not present

## 2015-02-10 DIAGNOSIS — T63451D Toxic effect of venom of hornets, accidental (unintentional), subsequent encounter: Secondary | ICD-10-CM | POA: Diagnosis not present

## 2015-02-10 DIAGNOSIS — J3089 Other allergic rhinitis: Secondary | ICD-10-CM | POA: Diagnosis not present

## 2015-02-10 DIAGNOSIS — Z91030 Bee allergy status: Secondary | ICD-10-CM | POA: Diagnosis not present

## 2015-02-10 DIAGNOSIS — T63441D Toxic effect of venom of bees, accidental (unintentional), subsequent encounter: Secondary | ICD-10-CM | POA: Diagnosis not present

## 2015-02-10 DIAGNOSIS — H1045 Other chronic allergic conjunctivitis: Secondary | ICD-10-CM | POA: Diagnosis not present

## 2015-02-15 ENCOUNTER — Encounter (HOSPITAL_COMMUNITY): Payer: Self-pay | Admitting: *Deleted

## 2015-02-15 ENCOUNTER — Observation Stay (HOSPITAL_COMMUNITY)
Admission: EM | Admit: 2015-02-15 | Discharge: 2015-02-17 | Disposition: A | Discharge status: Home / Self Care | Payer: Medicare Other | Attending: Internal Medicine | Admitting: Internal Medicine

## 2015-02-15 DIAGNOSIS — E871 Hypo-osmolality and hyponatremia: Principal | ICD-10-CM | POA: Diagnosis present

## 2015-02-15 DIAGNOSIS — R55 Syncope and collapse: Secondary | ICD-10-CM | POA: Diagnosis not present

## 2015-02-15 DIAGNOSIS — I10 Essential (primary) hypertension: Secondary | ICD-10-CM | POA: Diagnosis not present

## 2015-02-15 DIAGNOSIS — E876 Hypokalemia: Secondary | ICD-10-CM | POA: Diagnosis not present

## 2015-02-15 DIAGNOSIS — Z85038 Personal history of other malignant neoplasm of large intestine: Secondary | ICD-10-CM | POA: Insufficient documentation

## 2015-02-15 DIAGNOSIS — E039 Hypothyroidism, unspecified: Secondary | ICD-10-CM | POA: Diagnosis not present

## 2015-02-15 DIAGNOSIS — D75839 Thrombocytosis, unspecified: Secondary | ICD-10-CM | POA: Diagnosis present

## 2015-02-15 DIAGNOSIS — D509 Iron deficiency anemia, unspecified: Secondary | ICD-10-CM | POA: Insufficient documentation

## 2015-02-15 DIAGNOSIS — E785 Hyperlipidemia, unspecified: Secondary | ICD-10-CM | POA: Diagnosis not present

## 2015-02-15 DIAGNOSIS — R404 Transient alteration of awareness: Secondary | ICD-10-CM | POA: Diagnosis not present

## 2015-02-15 DIAGNOSIS — D649 Anemia, unspecified: Secondary | ICD-10-CM | POA: Diagnosis present

## 2015-02-15 DIAGNOSIS — E860 Dehydration: Secondary | ICD-10-CM | POA: Insufficient documentation

## 2015-02-15 DIAGNOSIS — D473 Essential (hemorrhagic) thrombocythemia: Secondary | ICD-10-CM | POA: Insufficient documentation

## 2015-02-15 HISTORY — DX: Anemia, unspecified: D64.9

## 2015-02-15 HISTORY — DX: Gastro-esophageal reflux disease without esophagitis: K21.9

## 2015-02-15 HISTORY — DX: Hyperlipidemia, unspecified: E78.5

## 2015-02-15 HISTORY — DX: Disorder of thyroid, unspecified: E07.9

## 2015-02-15 HISTORY — DX: Malignant neoplasm of colon, unspecified: C18.9

## 2015-02-15 HISTORY — DX: Diaphragmatic hernia without obstruction or gangrene: K44.9

## 2015-02-15 HISTORY — DX: Essential (primary) hypertension: I10

## 2015-02-15 LAB — CBC
HEMATOCRIT: 31 % — AB (ref 36.0–46.0)
HEMOGLOBIN: 10.1 g/dL — AB (ref 12.0–15.0)
MCH: 26.9 pg (ref 26.0–34.0)
MCHC: 32.6 g/dL (ref 30.0–36.0)
MCV: 82.7 fL (ref 78.0–100.0)
Platelets: 490 10*3/uL — ABNORMAL HIGH (ref 150–400)
RBC: 3.75 MIL/uL — AB (ref 3.87–5.11)
RDW: 14.4 % (ref 11.5–15.5)
WBC: 6.4 10*3/uL (ref 4.0–10.5)

## 2015-02-15 LAB — OSMOLALITY: Osmolality: 271 mOsm/kg — ABNORMAL LOW (ref 275–300)

## 2015-02-15 LAB — BASIC METABOLIC PANEL
Anion gap: 12 (ref 5–15)
BUN: 11 mg/dL (ref 6–20)
CO2: 24 mmol/L (ref 22–32)
Calcium: 8.8 mg/dL — ABNORMAL LOW (ref 8.9–10.3)
Chloride: 90 mmol/L — ABNORMAL LOW (ref 101–111)
Creatinine, Ser: 1.02 mg/dL — ABNORMAL HIGH (ref 0.44–1.00)
GFR calc Af Amer: 60 mL/min — ABNORMAL LOW (ref >60)
GFR, EST NON AFRICAN AMERICAN: 52 mL/min — AB (ref >60)
GLUCOSE: 141 mg/dL — AB (ref 65–99)
Potassium: 3.2 mmol/L — ABNORMAL LOW (ref 3.5–5.1)
Sodium: 126 mmol/L — ABNORMAL LOW (ref 135–145)

## 2015-02-15 LAB — TROPONIN I

## 2015-02-15 LAB — SAVE SMEAR

## 2015-02-15 LAB — TSH: TSH: 1.776 u[IU]/mL (ref 0.350–4.500)

## 2015-02-15 MED ORDER — ONDANSETRON HCL 4 MG PO TABS: 4.0000 mg | ORAL_TABLET | Freq: Four times a day (QID) | ORAL | Status: DC | PRN

## 2015-02-15 MED ORDER — IRBESARTAN 300 MG PO TABS
150.0000 mg | ORAL_TABLET | Freq: Every day | ORAL | Status: DC
  Administered 2015-02-15 – 2015-02-17 (×3): 150 mg via ORAL
  Filled 2015-02-15 (×3): qty 1

## 2015-02-15 MED ORDER — BISACODYL 5 MG PO TBEC: 5.0000 mg | DELAYED_RELEASE_TABLET | Freq: Every day | ORAL | Status: DC | PRN

## 2015-02-15 MED ORDER — ONDANSETRON HCL 4 MG/2ML IJ SOLN: 4.0000 mg | Freq: Four times a day (QID) | INTRAMUSCULAR | Status: DC | PRN

## 2015-02-15 MED ORDER — POTASSIUM CHLORIDE IN NACL 40-0.9 MEQ/L-% IV SOLN
INTRAVENOUS | Status: DC
  Administered 2015-02-15: 75 mL/h via INTRAVENOUS
  Filled 2015-02-15 (×2): qty 1000

## 2015-02-15 MED ORDER — SODIUM CHLORIDE 0.9 % IV BOLUS (SEPSIS)
500.0000 mL | Freq: Once | INTRAVENOUS | Status: AC
  Administered 2015-02-15: 500 mL via INTRAVENOUS

## 2015-02-15 MED ORDER — POTASSIUM CHLORIDE IN NACL 40-0.9 MEQ/L-% IV SOLN
INTRAVENOUS | Status: AC
  Administered 2015-02-16: 75 mL/h via INTRAVENOUS
  Filled 2015-02-15 (×2): qty 1000

## 2015-02-15 MED ORDER — ACETAMINOPHEN 325 MG PO TABS: 650.0000 mg | ORAL_TABLET | Freq: Four times a day (QID) | ORAL | Status: DC | PRN

## 2015-02-15 MED ORDER — SODIUM CHLORIDE 0.9 % IJ SOLN
3.0000 mL | Freq: Two times a day (BID) | INTRAMUSCULAR | Status: DC
  Administered 2015-02-15 – 2015-02-16 (×2): 3 mL via INTRAVENOUS

## 2015-02-15 MED ORDER — LEVOTHYROXINE SODIUM 75 MCG PO TABS
37.5000 ug | ORAL_TABLET | Freq: Every day | ORAL | Status: DC
  Administered 2015-02-16 – 2015-02-17 (×2): 37.5 ug via ORAL
  Filled 2015-02-15 (×2): qty 1

## 2015-02-15 MED ORDER — PANTOPRAZOLE SODIUM 40 MG PO TBEC
40.0000 mg | DELAYED_RELEASE_TABLET | Freq: Two times a day (BID) | ORAL | Status: DC
Start: 2015-02-15 — End: 2015-02-17
  Administered 2015-02-15 – 2015-02-17 (×4): 40 mg via ORAL
  Filled 2015-02-15 (×4): qty 1

## 2015-02-15 MED ORDER — AMLODIPINE BESYLATE 5 MG PO TABS
5.0000 mg | ORAL_TABLET | Freq: Every day | ORAL | Status: DC
  Administered 2015-02-16 – 2015-02-17 (×2): 5 mg via ORAL
  Filled 2015-02-15 (×2): qty 1

## 2015-02-15 MED ORDER — RALOXIFENE HCL 60 MG PO TABS
60.0000 mg | ORAL_TABLET | Freq: Every day | ORAL | Status: DC
  Administered 2015-02-16: 60 mg via ORAL
  Filled 2015-02-15 (×2): qty 1

## 2015-02-15 MED ORDER — ALUM & MAG HYDROXIDE-SIMETH 200-200-20 MG/5ML PO SUSP: 30.0000 mL | Freq: Four times a day (QID) | ORAL | Status: DC | PRN

## 2015-02-15 MED ORDER — ENOXAPARIN SODIUM 40 MG/0.4ML ~~LOC~~ SOLN
40.0000 mg | SUBCUTANEOUS | Status: DC
  Administered 2015-02-15 – 2015-02-16 (×2): 40 mg via SUBCUTANEOUS
  Filled 2015-02-15 (×2): qty 0.4

## 2015-02-15 MED ORDER — SIMVASTATIN 40 MG PO TABS
40.0000 mg | ORAL_TABLET | Freq: Every day | ORAL | Status: DC
  Administered 2015-02-15: 40 mg via ORAL
  Filled 2015-02-15: qty 1

## 2015-02-15 MED ORDER — ACETAMINOPHEN 650 MG RE SUPP: 650.0000 mg | Freq: Four times a day (QID) | RECTAL | Status: DC | PRN

## 2015-02-15 NOTE — H&P (Signed)
[facility] History and Physical                                                                                    Allison Knight, is a 79 y.o. female  MRN: [id number]   DOB - 16-Aug-1935  Admit Date - 02/15/2015  Outpatient Primary MD for the patient is Vena Austria, MD  Referring Physician:  Dr. Regenia Skeeter, edp  Chief Complaint:   Chief Complaint  Patient presents with  . Loss of Consciousness     HPI  Allison Knight  is a 79 y.o. female, with hypothyroidism, hypertension, and iron deficiency anemia who has a history of colon cancer. She presents the emergency department today after a syncopal episode.  She states that she has been feeling fatigued for about 2 weeks but figured it was just related to her chronic iron deficiency anemia.  She went to church this morning. The sanctuary was particularly hot as the air conditioning was not working well. Towards the end of the services she heard ringing in her years and was just thinking that she may not go out to lunch with her friends as she was feeling poorly. She lost consciousness.  A friend in the pew behind her caught her as she was slumping over. Within 15-30 seconds she woke back up. She was alert upon waking. She was diaphoretic but there was no tongue biting, incontinence, or convulsing. The patient reports that she has had one episode of syncope in the past many years ago when she is trying to varnish a piece of furniture.  She has always been generally healthy. Dr. Alyson Ingles changed one of her medications one month ago from valsartan-HCTZ to [facility] because of co-pay expense. Other than that she has had no changes in medications. She denies any chest pain or palpitations. She states that she did not have changes in vision or headache prior to the episode. She does not drink, she does not use tobacco, and she lives alone. Her husband passed about 7 years ago.  In the emergency department the patient appears stable. Labs  indicate a sodium of 126, potassium 3.2, hemoglobin 10.1.  Her EKG is sinus rhythm with no ST changes or blocks  Review of Systems   In addition to the HPI above,  No Fever-chills, No Headache, No changes with Vision or hearing, No problems swallowing food or Liquids, No Chest pain, Cough or Shortness of Breath, No Abdominal pain, No Nausea or Vomiting, Bowel movements are regular, No Blood in stool or Urine, No dysuria, No new skin rashes or bruises, No new joints pains-aches,  No new weakness, tingling, numbness in any extremity, No recent weight gain or loss, A full 10 point Review of Systems was done, except as stated above, all other Review of Systems were negative.  Past Medical History  Past Medical History  Diagnosis Date  . Hypertension   . Thyroid disease   . Hyperlipemia   . Hiatal hernia   . GERD (gastroesophageal reflux disease)   . Colon cancer   . Anemia     Past Surgical History  Procedure Laterality Date  . Colon surgery  Social History History  Substance Use Topics  . Smoking status: Never Smoker   . Smokeless tobacco: Not on file  . Alcohol Use: No   lives alone. Widowed  Family History Her father died of lung cancer. Her mother had hypertension. She knows of no CVA disease in the family  Prior to Admission medications   Medication Sig Start Date End Date Taking? Authorizing Provider  amLODipine (NORVASC) 5 MG tablet Take 5 mg by mouth daily. 02/09/15   Historical Provider, MD  [employer] GENTLE LAXATIVE 5 MG EC tablet Take 5 mg by mouth daily as needed for mild constipation.  12/09/14   Historical Provider, MD  levothyroxine (SYNTHROID, LEVOTHROID) 25 MCG tablet Take 37.5 mcg by mouth daily. 02/09/15   Historical Provider, MD  pantoprazole (PROTONIX) 40 MG tablet Take 40 mg by mouth 2 (two) times daily. 02/13/15   Historical Provider, MD  raloxifene (EVISTA) 60 MG tablet Take 60 mg by mouth daily. 02/05/15   Historical Provider, MD  simvastatin  (ZOCOR) 40 MG tablet Take 40 mg by mouth at bedtime. 02/09/15   Historical Provider, MD  telmisartan-hydrochlorothiazide (MICARDIS HCT) 80-25 MG per tablet Take 1 tablet by mouth every morning. 01/22/15   Historical Provider, MD    No Known Allergies  Physical Exam  Vitals  Blood pressure 157/83, pulse 93, temperature 97.8 F (36.6 C), temperature source Oral, resp. rate 14, height 5\' 1"  (1.549 m), weight 72.122 kg (159 lb), SpO2 100 %.   General:  Pleasant, well-appearing female,  lying in bed in NAD, appears younger than stated age  Psych:  Normal affect and insight, Not Suicidal or Homicidal, Awake Alert, Oriented X 3.  Neuro:   No F.N deficits, ALL C.Nerves Intact, Strength 5/5 all 4 extremities, Sensation intact all 4 extremities.  ENT:  Ears and Eyes appear Normal, Conjunctivae clear, PER. Moist oral mucosa without erythema or exudates.  Neck:  Supple, No lymphadenopathy appreciated  Respiratory:  Symmetrical chest wall movement, Good air movement bilaterally, CTAB.  Cardiac:  RRR, No Murmurs, trace bilateral lower extremity edema, no JVD.    Abdomen:  Positive bowel sounds, Soft, Non tender, Non distended,  No masses appreciated  Skin:  No Cyanosis, Normal Skin Turgor, No Skin Rash or Bruise.  Extremities:  Able to move all 4. 5/5 strength in each,  no effusions.  Data Review  CBC  Recent Labs Lab 02/15/15 1306  WBC 6.4  HGB 10.1*  HCT 31.0*  PLT 490*  MCV 82.7  MCH 26.9  MCHC 32.6  RDW 14.4    Chemistries   Recent Labs Lab 02/15/15 1306  NA 126*  K 3.2*  CL 90*  CO2 24  GLUCOSE 141*  BUN 11  CREATININE 1.02*  CALCIUM 8.8*     Imaging results:   My personal review of EKG: NSR, No ST changes noted.  Low voltage   Assessment & Plan  Principal Problem:   Syncope and collapse Active Problems:   Hyponatremia   Hypokalemia   Anemia   Thrombocytosis   Syncope and collapse Possibly due to dehydration and heat. Will check orthostatic  vital signs, check troponin 1, TSH. Patient denies chest pain. EKG is reassuring. There appeared to be no focal neuro deficits, no headache no changes in vision. I will not obtain a CT head at this time. Will give gentle IV hydration.  Patient does not appear to need even a physical therapy evaluation. Will monitor on telemetry overnight. Check 2-D echo.  Hyponatremia Possibly  due to HCTZ.  Unfortunately we do not have recent labs for this [facility] patient in our system. And her PCP office is closed on Sunday. Will check urine sodium, urine osmolality, serum sodium and osmolality. Give gentle IV hydration. Discontinue HCTZ for now. Discontinue telmisartan and amlodipine  Hypokalemia Possibly due to thiazide as well as. Will place potassium and IV fluids.  Thrombocytosis Uncertain etiology. Appeared to have platelets of 665 back in 2008. Check save smear. Likely stable for outpatient workup.  Chronic iron deficiency anemia Long-term. Treated by Dr. Alyson Ingles.  No frank signs of bleeding. No need for transfusion at this point. Stable for outpatient follow-up.   Consultants Called:  None  Family Communication:   Patient is alert oriented and understands her plan of care  Code Status:  Full code  Condition:  Stable  Potential Disposition: To home likely tomorrow if workup is negative.  Time spent in minutes : [address],  PA-C on 02/15/2015 at 3:45 PM Between 7am to 7pm - Pager - [phone] After 7pm go to [email or web address] - password [email or web address] And look for the night coverage person covering me after hours  [facility]

## 2015-02-15 NOTE — ED Notes (Signed)
Per EMS- pt reports a syncopal episode while at church lasting approx 15 seconds. Pt does not recall event. Denies hitting head. Pt states that the church was hot today. States that prior to event she had felt tired. States that she has hx of "low iron" and states that her last was 11. A&Ox4

## 2015-02-15 NOTE — Progress Notes (Signed)
Pt admitted to the unit at 1615. Pt mental status is alert and oriented. Pt oriented to room, staff, and call bell. Skin is intact. Full assessment charted in [facility]. Call bell within reach. Visitor guidelines reviewed w/ pt.

## 2015-02-15 NOTE — ED Provider Notes (Signed)
CSN: [id number]     Arrival date & time 02/15/15  1249 History   First MD Initiated Contact with Patient 02/15/15 1314     Chief Complaint  Patient presents with  . Loss of Consciousness     (Consider location/radiation/quality/duration/timing/severity/associated sxs/prior Treatment) HPI  79 year old female presents after a syncopal episode at church. Patient states the [employer] was hot due to a broken air-conditioner and that she was feeling uncomfortable during the whole service. At the end of the service she felt acute ringing inner ears and then the next thing she notes she was waking up with people surrounding her. She did not fall or hit her head. She was sitting down when this started. Apparently she lost consciousness for about 15 seconds per witnesses. Denies preceding or post chest pain, short of breath, or palpitations. Has a history of anemia and thinks that this may be recurring as she has been fatigued for the last several weeks. Denies blood in her stool.  Past Medical History  Diagnosis Date  . Hypertension   . Thyroid disease   . Hyperlipemia   . Hiatal hernia   . GERD (gastroesophageal reflux disease)    Past Surgical History  Procedure Laterality Date  . Colon surgery     No family history on file. History  Substance Use Topics  . Smoking status: Never Smoker   . Smokeless tobacco: Not on file  . Alcohol Use: No   OB History    No data available     Review of Systems  Constitutional: Positive for fatigue.  Respiratory: Negative for shortness of breath.   Cardiovascular: Negative for chest pain.  Gastrointestinal: Negative for nausea, vomiting and abdominal pain.  Neurological: Positive for syncope and weakness (generalized). Negative for headaches.  All other systems reviewed and are negative.     Allergies  Review of patient's allergies indicates no known allergies.  Home Medications   Prior to Admission medications   Not on File   BP 160/68  mmHg  Pulse 79  Temp(Src) 97.8 F (36.6 C) (Oral)  Resp 12  Ht 5\' 1"  (1.549 m)  Wt 159 lb (72.122 kg)  BMI 30.06 kg/m2  SpO2 100% Physical Exam  Constitutional: She is oriented to person, place, and time. She appears well-developed and well-nourished.  HENT:  Head: Normocephalic and atraumatic.  Right Ear: External ear normal.  Left Ear: External ear normal.  Nose: Nose normal.  Eyes: EOM are normal. Pupils are equal, round, and reactive to light. Right eye exhibits no discharge. Left eye exhibits no discharge.  Neck: Neck supple.  Cardiovascular: Normal rate, regular rhythm and normal heart sounds.   Pulmonary/Chest: Effort normal and breath sounds normal.  Abdominal: Soft. She exhibits no distension. There is no tenderness.  Musculoskeletal: She exhibits no edema.  Neurological: She is alert and oriented to person, place, and time.  CN 2-12 grossly intact. 5/5 strength in all 4 extremities. Normal finger to nose  Skin: Skin is warm and dry.  Nursing note and vitals reviewed.   ED Course  Procedures (including critical care time) Labs Review Labs Reviewed  BASIC METABOLIC PANEL - Abnormal; Notable for the following:    Sodium 126 (*)    Potassium 3.2 (*)    Chloride 90 (*)    Glucose, Bld 141 (*)    Creatinine, Ser 1.02 (*)    Calcium 8.8 (*)    GFR calc non Af Amer 52 (*)    GFR calc Af Amer 60 (*)  All other components within normal limits  CBC - Abnormal; Notable for the following:    RBC 3.75 (*)    Hemoglobin 10.1 (*)    HCT 31.0 (*)    Platelets 490 (*)    All other components within normal limits  CBG MONITORING, ED    Imaging Review No results found.   EKG Interpretation   Date/Time:  Sunday February 15 2015 12:53:17 EDT Ventricular Rate:  81 PR Interval:  195 QRS Duration: 89 QT Interval:  400 QTC Calculation: 464 R Axis:   19 Text Interpretation:  Sinus rhythm Low voltage, extremity and precordial  leads No old tracing to compare Confirmed by  [place] ([id number]) on  02/15/2015 1:17:30 PM      MDM   Final diagnoses:  Hyponatremia  Syncope, unspecified syncope type    Patient with significant hyponatremia as well as syncope while at rest. Patient appears well now and has a benign neuro exam. Her overall fatigue and weakness may be from the hyponatremia but could also be from her mild anemia as well. Her HCTZ is likely contributing to the hyponatremia. Given the degree of this, will need admission for supportive care, further workup, and telemetry monitoring.    Sherwood Gambler, MD 02/15/15 [phone]

## 2015-02-16 ENCOUNTER — Observation Stay (HOSPITAL_BASED_OUTPATIENT_CLINIC_OR_DEPARTMENT_OTHER): Payer: Medicare Other

## 2015-02-16 DIAGNOSIS — E871 Hypo-osmolality and hyponatremia: Secondary | ICD-10-CM | POA: Diagnosis not present

## 2015-02-16 DIAGNOSIS — R55 Syncope and collapse: Secondary | ICD-10-CM

## 2015-02-16 LAB — URINALYSIS, ROUTINE W REFLEX MICROSCOPIC
Bilirubin Urine: NEGATIVE
GLUCOSE, UA: NEGATIVE mg/dL
Hgb urine dipstick: NEGATIVE
Ketones, ur: NEGATIVE mg/dL
LEUKOCYTES UA: NEGATIVE
Nitrite: NEGATIVE
PH: 7 (ref 5.0–8.0)
PROTEIN: NEGATIVE mg/dL
Specific Gravity, Urine: 1.008 (ref 1.005–1.030)
UROBILINOGEN UA: 0.2 mg/dL (ref 0.0–1.0)

## 2015-02-16 LAB — BASIC METABOLIC PANEL
ANION GAP: 4 — AB (ref 5–15)
BUN: 8 mg/dL (ref 6–20)
CALCIUM: 8.7 mg/dL — AB (ref 8.9–10.3)
CO2: 25 mmol/L (ref 22–32)
Chloride: 102 mmol/L (ref 101–111)
Creatinine, Ser: 0.97 mg/dL (ref 0.44–1.00)
GFR, EST NON AFRICAN AMERICAN: 55 mL/min — AB (ref >60)
GLUCOSE: 105 mg/dL — AB (ref 65–99)
POTASSIUM: 3.9 mmol/L (ref 3.5–5.1)
Sodium: 131 mmol/L — ABNORMAL LOW (ref 135–145)

## 2015-02-16 LAB — HEMOGLOBIN A1C
Hgb A1c MFr Bld: 5.8 % — ABNORMAL HIGH (ref 4.8–5.6)
Mean Plasma Glucose: 120 mg/dL

## 2015-02-16 LAB — CBC
HCT: 31.8 % — ABNORMAL LOW (ref 36.0–46.0)
Hemoglobin: 10.3 g/dL — ABNORMAL LOW (ref 12.0–15.0)
MCH: 27.1 pg (ref 26.0–34.0)
MCHC: 32.4 g/dL (ref 30.0–36.0)
MCV: 83.7 fL (ref 78.0–100.0)
PLATELETS: 500 10*3/uL — AB (ref 150–400)
RBC: 3.8 MIL/uL — ABNORMAL LOW (ref 3.87–5.11)
RDW: 15.1 % (ref 11.5–15.5)
WBC: 6 10*3/uL (ref 4.0–10.5)

## 2015-02-16 LAB — GLUCOSE, CAPILLARY: Glucose-Capillary: 105 mg/dL — ABNORMAL HIGH (ref 65–99)

## 2015-02-16 LAB — SODIUM, URINE, RANDOM: Sodium, Ur: 80 mmol/L

## 2015-02-16 LAB — OSMOLALITY, URINE: Osmolality, Ur: 296 mOsm/kg — ABNORMAL LOW (ref 390–1090)

## 2015-02-16 MED ORDER — ATORVASTATIN CALCIUM 20 MG PO TABS
20.0000 mg | ORAL_TABLET | Freq: Every day | ORAL | Status: DC
  Administered 2015-02-16: 20 mg via ORAL
  Filled 2015-02-16: qty 1

## 2015-02-16 MED ORDER — IRBESARTAN 150 MG PO TABS: 150.0000 mg | ORAL_TABLET | Freq: Every day | ORAL | Status: DC

## 2015-02-16 NOTE — Discharge Instructions (Signed)
Follow with Primary MD Vena Austria, MD in 7 days   Get CBC, CMP, 2 view Chest X ray checked  by Primary MD next visit.    Activity: As tolerated with Full fall precautions use walker/cane & assistance as needed   Disposition Home     Diet: Heart Healthy    For Heart failure patients - Check your Weight same time everyday, if you gain over 2 pounds, or you develop in leg swelling, experience more shortness of breath or chest pain, call your Primary MD immediately. Follow Cardiac Low Salt Diet and 1.5 lit/day fluid restriction.   On your next visit with your primary care physician please Get Medicines reviewed and adjusted.   Please request your Prim.MD to go over all Hospital Tests and Procedure/Radiological results at the follow up, please get all Hospital records sent to your Prim MD by signing hospital release before you go home.   If you experience worsening of your admission symptoms, develop shortness of breath, life threatening emergency, suicidal or homicidal thoughts you must seek medical attention immediately by calling 911 or calling your MD immediately  if symptoms less severe.  You Must read complete instructions/literature along with all the possible adverse reactions/side effects for all the Medicines you take and that have been prescribed to you. Take any new Medicines after you have completely understood and accpet all the possible adverse reactions/side effects.   Do not drive, operating heavy machinery, perform activities at heights, swimming or participation in water activities or provide baby sitting services  until you have seen by the Cardiologist and advised to do so again.  Do not drive when taking Pain medications.    Do not take more than prescribed Pain, Sleep and Anxiety Medications  Special Instructions: If you have smoked or chewed Tobacco  in the last 2 yrs please stop smoking, stop any regular Alcohol  and or any Recreational drug  use.  Wear Seat belts while driving.   Please note  You were cared for by a hospitalist during your hospital stay. If you have any questions about your discharge medications or the care you received while you were in the hospital after you are discharged, you can call the unit and asked to speak with the hospitalist on call if the hospitalist that took care of you is not available. Once you are discharged, your primary care physician will handle any further medical issues. Please note that NO REFILLS for any discharge medications will be authorized once you are discharged, as it is imperative that you return to your primary care physician (or establish a relationship with a primary care physician if you do not have one) for your aftercare needs so that they can reassess your need for medications and monitor your lab values.

## 2015-02-16 NOTE — Care Management Note (Signed)
Case Management Note  Patient Details  Name: MOZETTA MURFIN MRN: [id number] Date of Birth: 09/08/1935  Subjective/Objective:                 Patient from home, self care, friends check in on her. She does not anticipate needing any home health. Patient may go home with 30 day monitor to be set up by cardiology prior to discharge.     Action/Plan:  Will continue to follow. No HH anticipated. Expected Discharge Date:  01/16/15               Expected Discharge Plan:  Home/Self Care  In-House Referral:     Discharge planning Services  CM Consult  Post Acute Care Choice:    Choice offered to:     DME Arranged:    DME Agency:     HH Arranged:    HH Agency:     Status of Service:  Completed, signed off  Medicare Important Message Given:    Date Medicare IM Given:    Medicare IM give by:    Date Additional Medicare IM Given:    Additional Medicare Important Message give by:     If discussed at [place] of Stay Meetings, dates discussed:    Additional Comments:  Carles Collet, RN 02/16/2015, 11:13 AM

## 2015-02-16 NOTE — Discharge Summary (Signed)
Allison Knight, is a 79 y.o. female  DOB 1936/02/03  MRN [id number].  Admission date:  02/15/2015  Admitting Physician  Theodis Blaze, MD  Discharge Date:  02/17/2015   Primary MD  Vena Austria, MD  Recommendations for primary care physician for things to follow:   Check CBC, BMP next visit.  Monitor orthostatics, 30 day event monitor ordered.   Admission Diagnosis  Hyponatremia [E87.1] Syncope, unspecified syncope type [R55]   Discharge Diagnosis  Hyponatremia [E87.1] Syncope, unspecified syncope type [R55]    Principal Problem:   Syncope and collapse Active Problems:   Hyponatremia   Hypokalemia   Anemia   Thrombocytosis      Past Medical History  Diagnosis Date  . Hypertension   . Thyroid disease   . Hyperlipemia   . Hiatal hernia   . GERD (gastroesophageal reflux disease)   . Colon cancer   . Anemia     Past Surgical History  Procedure Laterality Date  . Colon surgery         HPI  from the history and physical done on the day of admission:    Allison Knight is a 79 y.o. female, with hypothyroidism, hypertension, and iron deficiency anemia who has a history of colon cancer. She presents the emergency department today after a syncopal episode. She states that she has been feeling fatigued for about 2 weeks but figured it was just related to her chronic iron deficiency anemia. She went to church this morning. The sanctuary was particularly hot as the air conditioning was not working well. Towards the end of the services she heard ringing in her years and was just thinking that she may not go out to lunch with her friends as she was feeling poorly. She lost consciousness. A friend in the pew behind her caught her as she was slumping over. Within 15-30 seconds she woke back up. She was  alert upon waking. She was diaphoretic but there was no tongue biting, incontinence, or convulsing. The patient reports that she has had one episode of syncope in the past many years ago when she is trying to varnish a piece of furniture. She has always been generally healthy. Dr. Alyson Ingles changed one of her medications one month ago from valsartan-HCTZ to [facility] because of co-pay expense.  Other than that she has had no changes in medications. She denies any chest pain or palpitations. She states that she did not have changes in vision or headache prior to the episode. She does not drink, she does not use tobacco, and she lives alone. Her husband passed about 7 years ago.  In the emergency department the patient appears stable. Labs indicate a sodium of 126, potassium 3.2, hemoglobin 10.1. Her EKG is sinus rhythm with no ST changes or blocks     Hospital Course:     1. Syncope and collapse. Could have been dehydration from combination of being on HCTZ and excessive sweating at the church, however this happened when she was sitting and was  not found to be orthostatic upon admission. However she was mildly hyponatremic. Her echocardiogram and telemetry monitor stable, EKG was stable. Her symptoms have resolved after IV fluids and hydration. I have stopped her HCTZ component of her blood medications medication. We will provide her with 30 day event monitor and a cardiology follow-up upon discharge to make sure there is no dysrhythmia involved. Patient requested not to drive until she follows up with cardiologist.   2. Dehydration with hyponatremia. Stopped HCTZ, hydrated. Improved. Repeat BMP in a week by PCP.   3. Chronic and efficiency anemia and mild thrombocytosis. Outpatient follow-up with PCP, stable. Repeat CBC in a week by PCP.       Discharge Condition: Fair  Follow UP  Follow-up Information    Follow up with Vena Austria, MD. Schedule an appointment as soon as  possible for a visit on 02/23/2015.   Specialty:  Family Medicine   Why:  Monday, August 08th @ 08:15am   Contact information:   [place] [address] [phone]       Follow up with [place]. Schedule an appointment as soon as possible for a visit in 1 week.   Why:  syncope   Contact information:   [place] [address]        Consults obtained - none  Diet and Activity recommendation: See Discharge Instructions below  Discharge Instructions         Discharge Instructions    Diet - low sodium heart healthy    Complete by:  As directed      Discharge instructions    Complete by:  As directed   Follow with Primary MD Vena Austria, MD in 7 days   Get CBC, CMP, 2 view Chest X ray checked  by Primary MD next visit.    Activity: As tolerated with Full fall precautions use walker/cane & assistance as needed   Disposition Home     Diet: Heart Healthy    For Heart failure patients - Check your Weight same time everyday, if you gain over 2 pounds, or you develop in leg swelling, experience more shortness of breath or chest pain, call your Primary MD immediately. Follow Cardiac Low Salt Diet and 1.5 lit/day fluid restriction.   On your next visit with your primary care physician please Get Medicines reviewed and adjusted.   Please request your Prim.MD to go over all Hospital Tests and Procedure/Radiological results at the follow up, please get all Hospital records sent to your Prim MD by signing hospital release before you go home.   If you experience worsening of your admission symptoms, develop shortness of breath, life threatening emergency, suicidal or homicidal thoughts you must seek medical attention immediately by calling 911 or calling your MD immediately  if symptoms less severe.  You Must read complete instructions/literature along with all the possible adverse  reactions/side effects for all the Medicines you take and that have been prescribed to you. Take any new Medicines after you have completely understood and accpet all the possible adverse reactions/side effects.   Do not drive, operating heavy machinery, perform activities at heights, swimming or participation in water activities or provide baby sitting services  until you have seen by the Cardiologist and advised to do so again.  Do not drive when taking Pain medications.    Do not take more than prescribed Pain, Sleep and Anxiety Medications  Special Instructions:  If you have smoked or chewed Tobacco  in the last 2 yrs please stop smoking, stop any regular Alcohol  and or any Recreational drug use.  Wear Seat belts while driving.   Please note  You were cared for by a hospitalist during your hospital stay. If you have any questions about your discharge medications or the care you received while you were in the hospital after you are discharged, you can call the unit and asked to speak with the hospitalist on call if the hospitalist that took care of you is not available. Once you are discharged, your primary care physician will handle any further medical issues. Please note that NO REFILLS for any discharge medications will be authorized once you are discharged, as it is imperative that you return to your primary care physician (or establish a relationship with a primary care physician if you do not have one) for your aftercare needs so that they can reassess your need for medications and monitor your lab values.     Discharge patient    Complete by:  As directed      Increase activity slowly    Complete by:  As directed              Discharge Medications       Medication List    STOP taking these medications        telmisartan-hydrochlorothiazide 80-25 MG per tablet  Commonly known as:  MICARDIS HCT      TAKE these medications        acetaminophen 500 MG tablet  Commonly  known as:  TYLENOL  Take 500 mg by mouth daily as needed (pain).     ADVANCED EYE HEALTH 250-2.5-0.5 MG Caps  Take 1 tablet by mouth daily.     amLODipine 5 MG tablet  Commonly known as:  NORVASC  Take 5 mg by mouth daily.     aspirin EC 81 MG tablet  Take 81 mg by mouth at bedtime.     CITRACAL MAXIMUM 315-250 MG-UNIT Tabs  Generic drug:  Calcium Citrate-Vitamin D  Take 1 tablet by mouth daily.     Coenzyme Q10 100 MG capsule  Take 100 mg by mouth daily.     EPIPEN 2-PAK 0.3 mg/0.3 mL Soaj injection  Generic drug:  EPINEPHrine  Inject 0.3 mg into the muscle as needed (allergic reactions).     FERREX 150 PO  Take 150 mg by mouth daily.     Fish Oil 1200 MG Caps  Take 1,200 mg by mouth daily.     irbesartan 150 MG tablet  Commonly known as:  AVAPRO  Take 1 tablet (150 mg total) by mouth daily.     levothyroxine 25 MCG tablet  Commonly known as:  SYNTHROID, LEVOTHROID  Take 37.5 mcg by mouth daily before breakfast.     loratadine 10 MG tablet  Commonly known as:  CLARITIN  Take 10 mg by mouth daily as needed for allergies.     Magnesium 250 MG Tabs  Take 250 mg by mouth daily.     pantoprazole 40 MG tablet  Commonly known as:  PROTONIX  Take 40 mg by mouth 2 (two) times daily.     raloxifene 60 MG tablet  Commonly known as:  EVISTA  Take 60 mg by mouth daily after lunch.     simvastatin 40 MG tablet  Commonly known as:  ZOCOR  Take 40 mg by mouth at bedtime.  sodium chloride 0.65 % Soln nasal spray  Commonly known as:  OCEAN  Place 1 spray into both nostrils 2 (two) times daily.     SYSTANE OP  Place 1 drop into both eyes 2 (two) times daily.     vitamin B-12 1000 MCG tablet  Commonly known as:  CYANOCOBALAMIN  Take 1,000 mcg by mouth daily.     Vitamin D 2000 UNITS tablet  Take 2,000 Units by mouth daily.        Major procedures and Radiology Reports - PLEASE review detailed and final reports for all details, in brief -   TEE - follow  final report. Transcription error  Typed report says EF 60%, mild LVH, no Valve abnormalities.     No results found.  Micro Results   No results found for this or any previous visit (from the past 240 hour(s)).  Today   Subjective    Fransisco Beau today has no headache,no chest abdominal pain,no new weakness tingling or numbness, feels much better wants to go home today.    Objective   Blood pressure 148/75, pulse 75, temperature 98.4 F (36.9 C), temperature source Oral, resp. rate 18, height 5\' 1"  (1.549 m), weight 73.2 kg (161 lb 6 oz), SpO2 100 %.   Intake/Output Summary (Last 24 hours) at 02/17/15 0906 Last data filed at 02/16/15 1822  Gross per 24 hour  Intake  927.5 ml  Output      5 ml  Net  922.5 ml    Exam  Awake Alert, Oriented x 3, No new F.N deficits, Normal affect [place].AT,PERRAL Supple Neck,No JVD, No cervical lymphadenopathy appriciated.  Symmetrical Chest wall movement, Good air movement bilaterally, CTAB RRR,No Gallops,Rubs or new Murmurs, No Parasternal Heave +ve B.Sounds, Abd Soft, Non tender, No organomegaly appriciated, No rebound -guarding or rigidity. No Cyanosis, Clubbing or edema, No new Rash or bruise.    Data Review   CBC w Diff:  Lab Results  Component Value Date   WBC 6.0 02/16/2015   WBC 5.3 01/18/2007   HGB 10.3* 02/16/2015   HGB 12.4 01/18/2007   HCT 31.8* 02/16/2015   HCT 36.0 01/18/2007   PLT 500* 02/16/2015   PLT 332 01/18/2007   LYMPHOPCT 28.7 01/18/2007   MONOPCT 9.2 01/18/2007   EOSPCT 2.4 01/18/2007   BASOPCT 1.0 01/18/2007    CMP:  Lab Results  Component Value Date   NA 131* 02/16/2015   K 3.9 02/16/2015   CL 102 02/16/2015   CO2 25 02/16/2015   BUN 8 02/16/2015   CREATININE 0.97 02/16/2015   PROT 7.6 08/31/2006   ALBUMIN 4.3 08/31/2006   BILITOT 0.3 08/31/2006   ALKPHOS 86 08/31/2006   AST 17 08/31/2006   ALT 11 08/31/2006  .   Total Time in preparing paper work, data evaluation and todays exam - 35  minutes  Thurnell Lose M.D on 02/17/2015 at 9:06 AM  [facility]   Office  [phone]

## 2015-02-16 NOTE — Progress Notes (Signed)
PT Cancellation Note  Patient Details Name: Allison Knight MRN: [id number] DOB: 06/05/1936   Cancelled Treatment:    Reason Eval/Treat Not Completed: PT screened, no needs identified, will sign off. Pt observed moving around room independently without any difficulty.   Sirus Labrie 02/16/2015, 9:17 AM  [place]

## 2015-02-16 NOTE — Progress Notes (Signed)
  Echocardiogram 2D Echocardiogram has been performed.  Jennette Dubin 02/16/2015, 4:59 PM

## 2015-02-17 DIAGNOSIS — E871 Hypo-osmolality and hyponatremia: Secondary | ICD-10-CM | POA: Diagnosis not present

## 2015-02-17 LAB — URINE CULTURE

## 2015-02-17 LAB — GLUCOSE, CAPILLARY: Glucose-Capillary: 88 mg/dL (ref 65–99)

## 2015-02-17 NOTE — Progress Notes (Signed)
Patient discharge teaching given, including activity, diet, follow up appointment, and mediations. Patient verbalized understanding of all discharge instructions. IV access was discontinued. Vitals are stable, Skin intact except as charted in most recent assessments. Patient to be escorted out by volunteer, to be driven home by neighbor.

## 2015-02-19 ENCOUNTER — Encounter: Payer: Self-pay | Admitting: Nurse Practitioner

## 2015-02-19 ENCOUNTER — Ambulatory Visit (INDEPENDENT_AMBULATORY_CARE_PROVIDER_SITE_OTHER): Payer: Medicare Other

## 2015-02-19 ENCOUNTER — Ambulatory Visit (INDEPENDENT_AMBULATORY_CARE_PROVIDER_SITE_OTHER): Payer: Medicare Other | Admitting: Nurse Practitioner

## 2015-02-19 VITALS — BP 160/84 | HR 93 | Ht 61.0 in | Wt 160.0 lb

## 2015-02-19 DIAGNOSIS — R55 Syncope and collapse: Secondary | ICD-10-CM | POA: Diagnosis not present

## 2015-02-19 DIAGNOSIS — I10 Essential (primary) hypertension: Secondary | ICD-10-CM | POA: Diagnosis not present

## 2015-02-19 NOTE — Progress Notes (Signed)
Patient Name: Allison Knight Date of Encounter: 02/19/2015  Primary Care Provider:  Vena Austria, MD Primary Cardiologist:  [place]. Lovena Le, MD   Chief Complaint  79 year old female status post recent hospitalization for syncope who presents for new patient evaluation.  Past Medical History   Past Medical History  Diagnosis Date  . Hypertension   . Thyroid disease   . Hyperlipemia   . Hiatal hernia   . GERD (gastroesophageal reflux disease)   . Colon cancer   . Anemia   . Syncope     a. 02/2015 Echo: Ef 60-65%, Gr 1 DD;  b. Felt to be dehydrated, HCTZ d/c'd.   Past Surgical History  Procedure Laterality Date  . Colon surgery      Allergies  Allergies  Allergen Reactions  . Hornet Venom Anaphylaxis  . Yellow Jacket Venom [Bee Venom] Anaphylaxis    HPI  79 year old female with a prior history of hypertension and hypothyroidism. She lives locally by herself and is very active. She gardens and does a fair amount of yard work. She is described by her daughter as being the most active 52 year old there is. Patient has no prior history of chest pain, dyspnea, or palpitations. She has not noticed any significant limitations of her exercise tolerance. On July 31, she was at church and while sitting during the service, she felt as though it was warmer than usual in the church. Towards the end of service, while sitting, she had sudden onset of ringing in her ears. She did not experience any presyncope but was subsequently witnessed to have slumped to her right while losing consciousness. The person in the pew behind her was able to catch her before she fell completely. The patient has no recollection of losing consciousness and never felt as though she was about to lose consciousness. Her next recollection is that of regaining consciousness while churchgoers and EMTs were standing around her. She is not sure how much time passed prior to regaining consciousness but presumes it  was several minutes as the EMTs were already there. When she regained consciousness, she felt tired but was not experiencing presyncope, chest pain, or dyspnea. Another churchgoer described her as feeling clammy. She was taken to [facility] where she was found to be hyponatremic with otherwise unrevealing labs. She was admitted to internal medicine and her home dose of hydrochlorothiazide was discontinued. An echocardiogram was performed and showed normal LV function. There were apparently no events on telemetry and she was discharged home and advised to follow-up with cardiology today. She has not had any recurrence of syncope or presyncope. She denies any history of chest pain, dyspnea, PND, orthopnea, dizziness, edema, or early satiety.  Home Medications  Prior to Admission medications   Medication Sig Start Date End Date Taking? Authorizing Provider  acetaminophen (TYLENOL) 500 MG tablet Take 500 mg by mouth daily as needed (pain).   Yes Historical Provider, MD  amLODipine (NORVASC) 5 MG tablet Take 5 mg by mouth daily. 02/09/15  Yes Historical Provider, MD  aspirin EC 81 MG tablet Take 81 mg by mouth at bedtime.   Yes Historical Provider, MD  Calcium Citrate-Vitamin D (CITRACAL MAXIMUM) 315-250 MG-UNIT TABS Take 1 tablet by mouth daily.   Yes Historical Provider, MD  Cholecalciferol (VITAMIN D) 2000 UNITS tablet Take 2,000 Units by mouth daily.   Yes Historical Provider, MD  Coenzyme Q10 100 MG capsule Take 100 mg by mouth daily.   Yes Historical Provider, MD  EPINEPHrine (EPIPEN 2-PAK) 0.3 mg/0.3 mL IJ SOAJ injection Inject 0.3 mg into the muscle as needed (allergic reactions).   Yes Historical Provider, MD  irbesartan (AVAPRO) 150 MG tablet Take 1 tablet (150 mg total) by mouth daily. 02/16/15  Yes Thurnell Lose, MD  levothyroxine (SYNTHROID, LEVOTHROID) 25 MCG tablet Take 37.5 mcg by mouth daily before breakfast.  02/09/15  Yes Historical Provider, MD  loratadine (CLARITIN) 10 MG tablet Take 10  mg by mouth daily as needed for allergies.   Yes Historical Provider, MD  Magnesium 250 MG TABS Take 250 mg by mouth daily.   Yes Historical Provider, MD  Omega 3-Lutein-Zeaxanthin (ADVANCED EYE HEALTH) 250-2.5-0.5 MG CAPS Take 1 tablet by mouth daily.   Yes Historical Provider, MD  Omega-3 Fatty Acids (FISH OIL) 1200 MG CAPS Take 1,200 mg by mouth daily.   Yes Historical Provider, MD  pantoprazole (PROTONIX) 40 MG tablet Take 40 mg by mouth 2 (two) times daily. 02/13/15  Yes Historical Provider, MD  Polyethyl Glycol-Propyl Glycol (SYSTANE OP) Place 1 drop into both eyes 2 (two) times daily.   Yes Historical Provider, MD  Polysaccharide Iron Complex (FERREX 150 PO) Take 150 mg by mouth daily.   Yes Historical Provider, MD  raloxifene (EVISTA) 60 MG tablet Take 60 mg by mouth daily after lunch.  02/05/15  Yes Historical Provider, MD  simvastatin (ZOCOR) 40 MG tablet Take 40 mg by mouth at bedtime. 02/09/15  Yes Historical Provider, MD  sodium chloride (OCEAN) 0.65 % SOLN nasal spray Place 1 spray into both nostrils 2 (two) times daily.   Yes Historical Provider, MD  vitamin B-12 (CYANOCOBALAMIN) 1000 MCG tablet Take 1,000 mcg by mouth daily.   Yes Historical Provider, MD  telmisartan-hydrochlorothiazide (MICARDIS HCT) 80-25 MG per tablet  02/18/15   Historical Provider, MD    Family History  Family History  Problem Relation Age of Onset  . Congestive Heart Failure Mother     died @ 52.  . Lung cancer Father     died @ 75.  . Other Sister     alive and well  . Other Sister     died in her 60's of unknown causes.    Social History  History   Social History  . Marital Status: Widowed    Spouse Name: N/A  . Number of Children: N/A  . Years of Education: N/A   Occupational History  . Not on file.   Social History Main Topics  . Smoking status: Never Smoker   . Smokeless tobacco: Not on file  . Alcohol Use: No  . Drug Use: No  . Sexual Activity: Not on file   Other Topics Concern    . Not on file   Social History Narrative   Lives in [place] by herself.  Husband died in 10/31/2012.  Very active.     Review of Systems  General:  No chills, fever, night sweats or weight changes.  Cardiovascular:   Syncopal episode while in church on Sunday, July 31. No chest pain, dyspnea on exertion, edema, orthopnea, palpitations, paroxysmal nocturnal dyspnea. Dermatological: No rash, lesions/masses Respiratory: No cough, dyspnea Urologic: No hematuria, dysuria Abdominal:   No nausea, vomiting, diarrhea, bright red blood per rectum, melena, or hematemesis Neurologic:  No visual changes, wkns, changes in mental status. All other systems reviewed and are otherwise negative except as noted above.  Physical Exam  VS:  BP 160/84 mmHg  Pulse 93  Ht 5\' 1"  (1.549 m)  Wt 160 lb (72.576 kg)  BMI 30.25 kg/m2 , BMI Body mass index is 30.25 kg/(m^2). GEN: Well nourished, well developed, in no acute distress. HEENT: normal. Neck: Supple, no JVD, carotid bruits, or masses. Cardiac: RRR, no murmurs, rubs, or gallops. No clubbing, cyanosis, edema.  Radials/DP/PT 2+ and equal bilaterally.  Respiratory:  Respirations regular and unlabored, clear to auscultation bilaterally. GI: Soft, nontender, nondistended, BS + x 4. MS: no deformity or atrophy. Skin: warm and dry, no rash. Neuro:  Strength and sensation are intact. Psych: Normal affect.  Accessory Clinical Findings  ECG - regular sinus rhythm, 93, delayed R-wave progression, no acute ST or T changes.  Assessment & Plan  1.  Syncope: Patient had a prolonged episode of syncope while at church on July 31. This occurred while sitting and was preceded by a sensation of being hot and ringing in her ears. She did not express presyncope prior to losing consciousness and based on her description of events, it appears that she was without consciousness for several minutes. Another churchgoer told her that she was clammy during the event. She had no acute  changes on ECG, no events on telemetry, and a normal echocardiogram. I suspect that this represents vasomotor syncope rather than arrhythmia. She has been set up for 30 day event monitor. She has been evaluated by Dr. Lovena Le today and will follow-up with him in approximately 4-6 weeks.   2. Essential hypertension:  Her blood pressure is elevated today at 160/84. She says that typically her blood pressure is between 470 and 962 systolic. She believes that it is up because she was nervous about this appointment. She remains on amlodipine and Avapro therapy. Hydrochlorothiazide was discontinued during her hospitalization secondary to hyponatremia and suspicion that dehydration may have played a role in her syncope. I will make no changes of her blood pressure medicines today and she will continue to follow her pressure at home.  3. Hypothyroidism: She is on chronic Synthroid therapy with a TSH that was normal on July 31 at 1.776.  4. Disposition: Follow-up thirty-day event monitor. Follow up with Dr. Lovena Le in 4-6 weeks.   Murray Hodgkins, NP 02/19/2015, 1:18 PM  EP Attending  Patient seen and examined. I have reviewed the history, exam, data and assessment and plan as documented by Mr. Sharolyn Douglas. I concur with his findings and plan. I will see her in consultation in 6 weeks.  Mikle [email or web address].D.

## 2015-02-19 NOTE — Patient Instructions (Signed)
Medication Instructions:   Your physician recommends that you continue on your current medications as directed. Please refer to the Current Medication list given to you today.  Labwork:  NONE ORDER TODAY   Testing/Procedures: Your physician has recommended that you wear an event monitor. Event monitors are medical devices that record the heart's electrical activity. Doctors most often [place] these monitors to diagnose arrhythmias. Arrhythmias are problems with the speed or rhythm of the heartbeat. The monitor is a small, portable device. You can wear one while you do your normal daily activities. This is usually used to diagnose what is causing palpitations/syncope (passing out).   Follow-Up:  WITH DR Lovena Le IN 4 TO 6 WEEKS     Any Other Special Instructions Will Be Listed Below (If Applicable).

## 2015-02-23 DIAGNOSIS — I10 Essential (primary) hypertension: Secondary | ICD-10-CM | POA: Diagnosis not present

## 2015-02-23 DIAGNOSIS — E871 Hypo-osmolality and hyponatremia: Secondary | ICD-10-CM | POA: Diagnosis not present

## 2015-02-23 DIAGNOSIS — I951 Orthostatic hypotension: Secondary | ICD-10-CM | POA: Diagnosis not present

## 2015-03-20 DIAGNOSIS — T63451D Toxic effect of venom of hornets, accidental (unintentional), subsequent encounter: Secondary | ICD-10-CM | POA: Diagnosis not present

## 2015-03-20 DIAGNOSIS — J3089 Other allergic rhinitis: Secondary | ICD-10-CM | POA: Diagnosis not present

## 2015-03-20 IMAGING — CR DG CHEST 2V
2 series · 2 of 2 positions shown · non-contrast
Comparison: 08/14/2006

CLINICAL DATA: Cough and congestion.

EXAM:
CHEST - 2 VIEW

[view not recorded (1 of 2)]
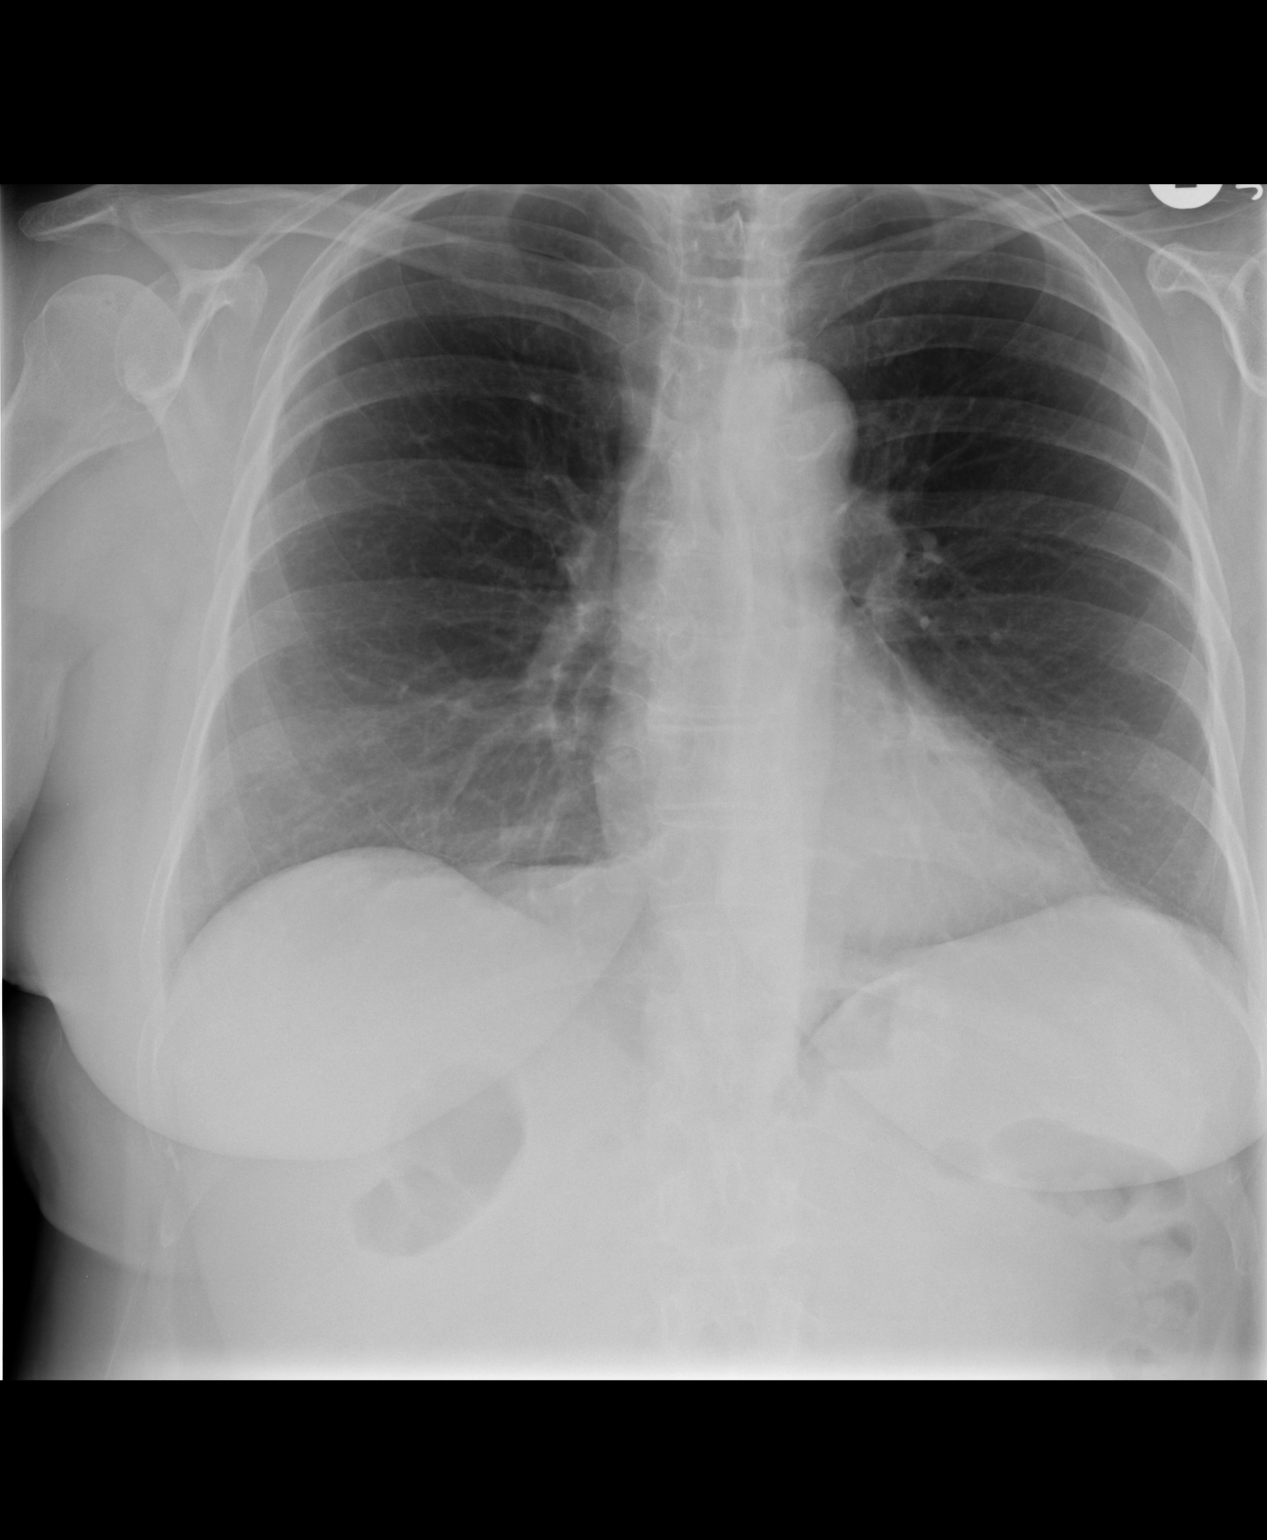

[view not recorded (2 of 2)]
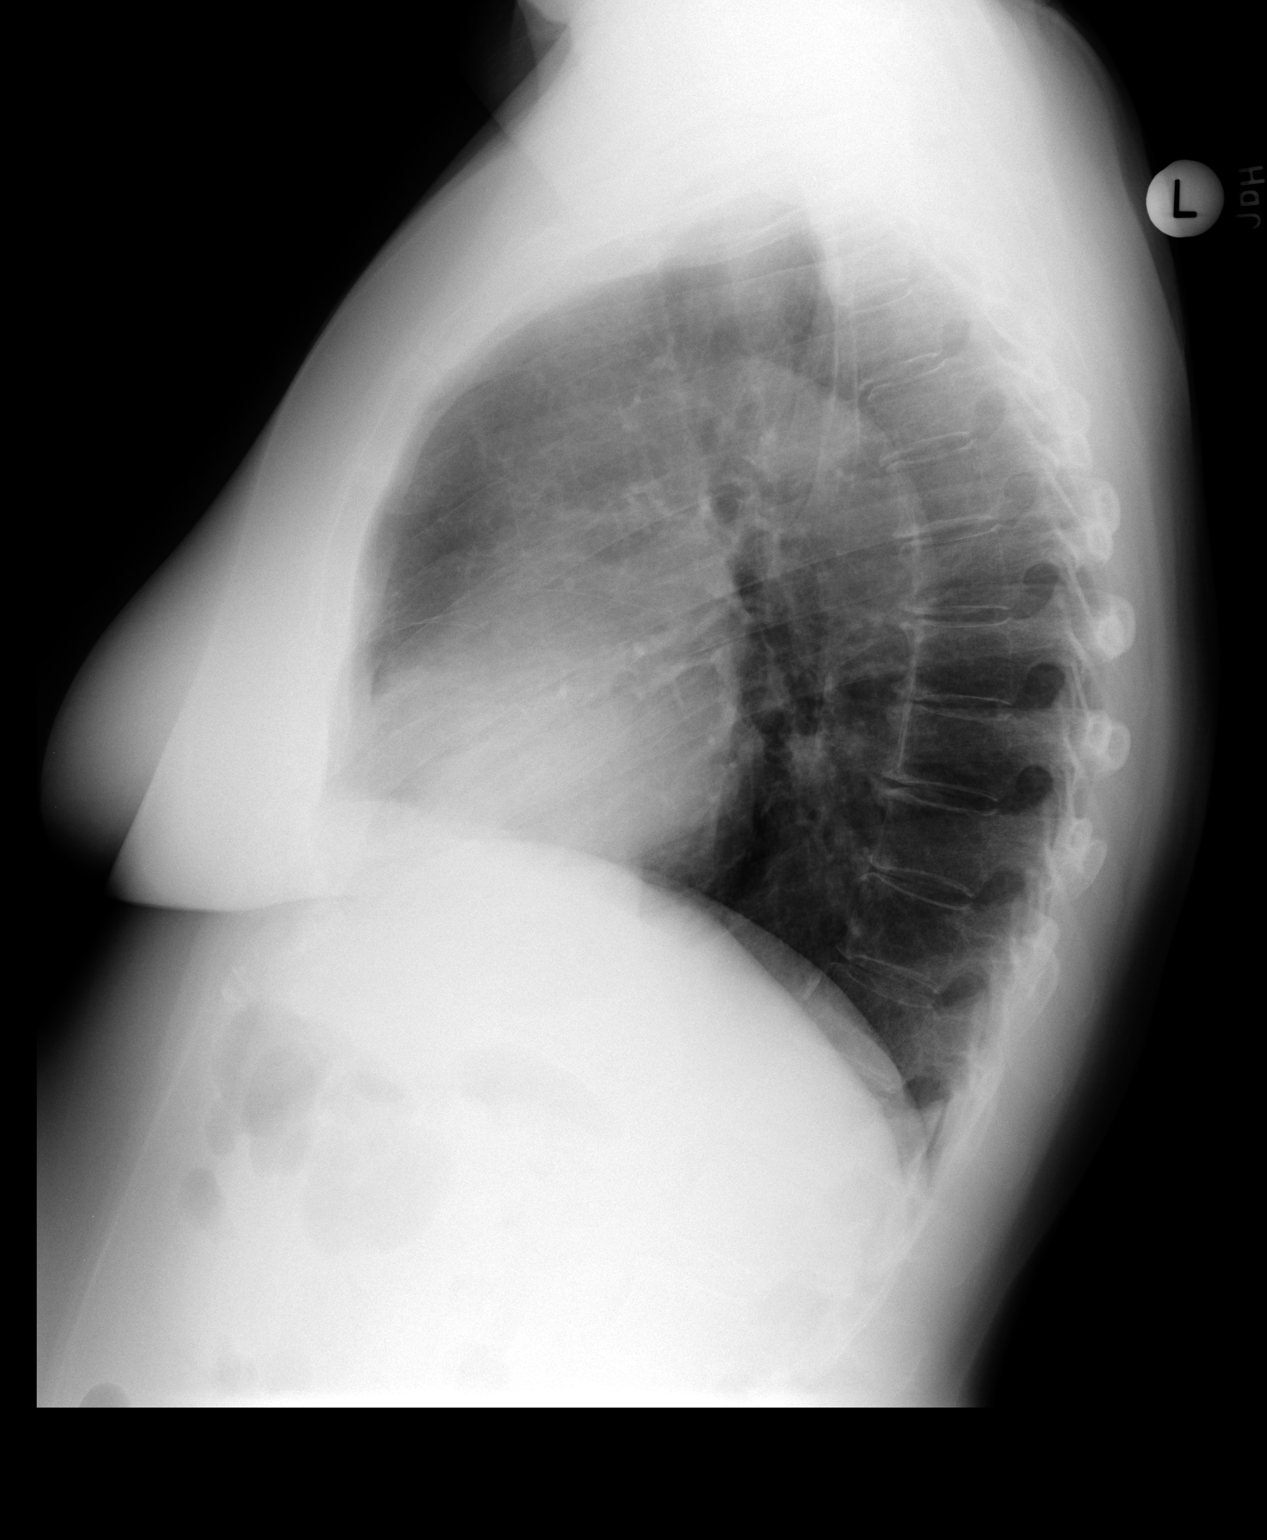

[2 of 2 positions shown; findings below may reference images not displayed]

FINDINGS: The heart size and mediastinal contours are within normal limits.
There is no evidence of pulmonary edema, consolidation,
pneumothorax, nodule or pleural fluid. The visualized skeletal
structures are unremarkable.
IMPRESSION: No active disease.

## 2015-04-03 ENCOUNTER — Encounter: Payer: Self-pay | Admitting: Internal Medicine

## 2015-04-03 ENCOUNTER — Ambulatory Visit (INDEPENDENT_AMBULATORY_CARE_PROVIDER_SITE_OTHER): Payer: Medicare Other | Admitting: Internal Medicine

## 2015-04-03 VITALS — BP 130/80 | HR 75 | Ht 61.0 in | Wt 161.0 lb

## 2015-04-03 DIAGNOSIS — I10 Essential (primary) hypertension: Secondary | ICD-10-CM | POA: Insufficient documentation

## 2015-04-03 DIAGNOSIS — R55 Syncope and collapse: Secondary | ICD-10-CM

## 2015-04-03 NOTE — Patient Instructions (Signed)
Medication Instructions:  Your physician recommends that you continue on your current medications as directed. Please refer to the Current Medication list given to you today.   Labwork: None ordered  Testing/Procedures: None ordered  Follow-Up: No follow up is needed at this time with Dr. Taylor.  He will see you on an as needed basis.   Any Other Special Instructions Will Be Listed Below (If Applicable). Thank you for choosing [facility]!!         

## 2015-04-03 NOTE — Assessment & Plan Note (Signed)
The etiology is unclear. As she has only had a single episode in her life, I would recommend watchful waiting. With her advanced age, symptomatic bradycardia a possibility but her prolonged period of unresponsiveness makes bradycardia less likely unless it was associated with cardiac inhibition. An ILR would be a strong consideration if she passes out again.

## 2015-04-03 NOTE — Progress Notes (Signed)
HPI Allison Knight is referred today for evaluation of syncope. She is a pleasant 79 yo woman who has had good health. She has had HTN. She does not have a h/o syncope. She was at church. She was listening to the end of the sermon when she experienced some ringing in her ears. The next thing she remembered she was looking at the paramedics.She was taken to the ED and workup including 2D echo was unremarkable. She did not lose control of her bowel or bladder and no tongue biting. She was not noted to have either brady or tachycardia on cardiac monitoring. No other complaints.  Allergies  Allergen Reactions  . Hornet Venom Anaphylaxis  . Yellow Jacket Venom [Bee Venom] Anaphylaxis     Current Outpatient Prescriptions  Medication Sig Dispense Refill  . acetaminophen (TYLENOL) 500 MG tablet Take 500 mg by mouth daily as needed (pain).    Marland Kitchen amLODipine (NORVASC) 5 MG tablet Take 5 mg by mouth daily.  0  . aspirin EC 81 MG tablet Take 81 mg by mouth at bedtime.    . Calcium Citrate-Vitamin D (CITRACAL MAXIMUM) 315-250 MG-UNIT TABS Take 1 tablet by mouth daily.    . Cholecalciferol (VITAMIN D) 2000 UNITS tablet Take 2,000 Units by mouth daily.    . Coenzyme Q10 100 MG capsule Take 100 mg by mouth daily.    Marland Kitchen EPINEPHrine (EPIPEN 2-PAK) 0.3 mg/0.3 mL IJ SOAJ injection Inject 0.3 mg into the muscle as needed (allergic reactions).    Marland Kitchen levothyroxine (SYNTHROID, LEVOTHROID) 25 MCG tablet Take 37.5 mcg by mouth daily before breakfast.   1  . loratadine (CLARITIN) 10 MG tablet Take 10 mg by mouth daily as needed for allergies.    . Magnesium 250 MG TABS Take 250 mg by mouth daily.    Ernestine Conrad 3-Lutein-Zeaxanthin (ADVANCED EYE HEALTH) 250-2.5-0.5 MG CAPS Take 1 tablet by mouth daily.    . Omega-3 Fatty Acids (FISH OIL) 1200 MG CAPS Take 1,200 mg by mouth daily.    . pantoprazole (PROTONIX) 40 MG tablet Take 40 mg by mouth 2 (two) times daily.  1  . Polyethyl Glycol-Propyl Glycol (SYSTANE OP) Place 1  drop into both eyes 2 (two) times daily.    . Polysaccharide Iron Complex (FERREX 150 PO) Take 150 mg by mouth daily.    . raloxifene (EVISTA) 60 MG tablet Take 60 mg by mouth daily after lunch.   1  . simvastatin (ZOCOR) 40 MG tablet Take 40 mg by mouth at bedtime.  0  . sodium chloride (OCEAN) 0.65 % SOLN nasal spray Place 1 spray into both nostrils 2 (two) times daily.    . valsartan (DIOVAN) 320 MG tablet Take 320 mg by mouth daily.  0  . vitamin B-12 (CYANOCOBALAMIN) 1000 MCG tablet Take 1,000 mcg by mouth daily.     No current facility-administered medications for this visit.     Past Medical History  Diagnosis Date  . Hypertension   . Thyroid disease   . Hyperlipemia   . Hiatal hernia   . GERD (gastroesophageal reflux disease)   . Colon cancer   . Anemia   . Syncope     a. 02/2015 Echo: Ef 60-65%, Gr 1 DD;  b. Felt to be dehydrated, HCTZ d/c'd.    ROS:   All systems reviewed and negative except as noted in the HPI.   Past Surgical History  Procedure Laterality Date  . Colon surgery  Family History  Problem Relation Age of Onset  . Congestive Heart Failure Mother     died @ 61.  . Lung cancer Father     died @ 33.  . Other Sister     alive and well  . Other Sister     died in her 46's of unknown causes.     Social History   Social History  . Marital Status: Widowed    Spouse Name: N/A  . Number of Children: N/A  . Years of Education: N/A   Occupational History  . Not on file.   Social History Main Topics  . Smoking status: Never Smoker   . Smokeless tobacco: Not on file  . Alcohol Use: No  . Drug Use: No  . Sexual Activity: Not on file   Other Topics Concern  . Not on file   Social History Narrative   Lives in [place] by herself.  Husband died in 2012/11/11.  Very active.     BP 130/80 mmHg  Pulse 75  Ht 5\' 1"  (1.549 m)  Wt 161 lb (73.029 kg)  BMI 30.44 kg/m2  SpO2 99%  Physical Exam:  Well appearing 79 yo woman, NAD HEENT:  Unremarkable Neck:  6 cm JVD, no thyromegally Lymphatics:  No adenopathy Back:  No CVA tenderness Lungs:  Clear with no wheezes. HEART:  Regular rate rhythm, no murmurs, no rubs, no clicks Abd:  soft, positive bowel sounds, no organomegally, no rebound, no guarding Ext:  2 plus pulses, no edema, no cyanosis, no clubbing Skin:  No rashes no nodules Neuro:  CN II through XII intact, motor grossly intact  EKG - NSR   Assess/Plan:

## 2015-04-03 NOTE — Assessment & Plan Note (Signed)
She will continue her calcium blocker and ARB.

## 2015-04-09 DIAGNOSIS — I10 Essential (primary) hypertension: Secondary | ICD-10-CM | POA: Diagnosis not present

## 2015-04-09 DIAGNOSIS — Z23 Encounter for immunization: Secondary | ICD-10-CM | POA: Diagnosis not present

## 2015-04-29 DIAGNOSIS — T63451D Toxic effect of venom of hornets, accidental (unintentional), subsequent encounter: Secondary | ICD-10-CM | POA: Diagnosis not present

## 2015-04-29 DIAGNOSIS — J3089 Other allergic rhinitis: Secondary | ICD-10-CM | POA: Diagnosis not present

## 2015-04-30 DIAGNOSIS — Z1231 Encounter for screening mammogram for malignant neoplasm of breast: Secondary | ICD-10-CM | POA: Diagnosis not present

## 2015-05-06 ENCOUNTER — Encounter: Payer: Self-pay | Admitting: Gastroenterology

## 2015-06-03 DIAGNOSIS — J3089 Other allergic rhinitis: Secondary | ICD-10-CM | POA: Diagnosis not present

## 2015-06-03 DIAGNOSIS — T63441D Toxic effect of venom of bees, accidental (unintentional), subsequent encounter: Secondary | ICD-10-CM | POA: Diagnosis not present

## 2015-06-19 ENCOUNTER — Telehealth: Payer: Self-pay | Admitting: Internal Medicine

## 2015-07-14 DIAGNOSIS — J3089 Other allergic rhinitis: Secondary | ICD-10-CM | POA: Diagnosis not present

## 2015-07-14 DIAGNOSIS — T63451D Toxic effect of venom of hornets, accidental (unintentional), subsequent encounter: Secondary | ICD-10-CM | POA: Diagnosis not present

## 2015-08-10 ENCOUNTER — Encounter: Payer: Self-pay | Admitting: Internal Medicine

## 2015-08-11 DIAGNOSIS — R05 Cough: Secondary | ICD-10-CM | POA: Diagnosis not present

## 2015-08-11 DIAGNOSIS — K449 Diaphragmatic hernia without obstruction or gangrene: Secondary | ICD-10-CM | POA: Diagnosis not present

## 2015-08-11 DIAGNOSIS — Z85038 Personal history of other malignant neoplasm of large intestine: Secondary | ICD-10-CM | POA: Diagnosis not present

## 2015-08-13 DIAGNOSIS — T63441D Toxic effect of venom of bees, accidental (unintentional), subsequent encounter: Secondary | ICD-10-CM | POA: Diagnosis not present

## 2015-08-13 DIAGNOSIS — T63451D Toxic effect of venom of hornets, accidental (unintentional), subsequent encounter: Secondary | ICD-10-CM | POA: Diagnosis not present

## 2015-08-17 DIAGNOSIS — H01005 Unspecified blepharitis left lower eyelid: Secondary | ICD-10-CM | POA: Diagnosis not present

## 2015-08-17 DIAGNOSIS — H01004 Unspecified blepharitis left upper eyelid: Secondary | ICD-10-CM | POA: Diagnosis not present

## 2015-09-09 DIAGNOSIS — H26492 Other secondary cataract, left eye: Secondary | ICD-10-CM | POA: Diagnosis not present

## 2015-09-09 DIAGNOSIS — H43813 Vitreous degeneration, bilateral: Secondary | ICD-10-CM | POA: Diagnosis not present

## 2015-09-09 DIAGNOSIS — H524 Presbyopia: Secondary | ICD-10-CM | POA: Diagnosis not present

## 2015-09-09 DIAGNOSIS — H0014 Chalazion left upper eyelid: Secondary | ICD-10-CM | POA: Diagnosis not present

## 2015-09-10 DIAGNOSIS — K219 Gastro-esophageal reflux disease without esophagitis: Secondary | ICD-10-CM | POA: Diagnosis not present

## 2015-09-10 DIAGNOSIS — E7800 Pure hypercholesterolemia, unspecified: Secondary | ICD-10-CM | POA: Diagnosis not present

## 2015-09-10 DIAGNOSIS — M8580 Other specified disorders of bone density and structure, unspecified site: Secondary | ICD-10-CM | POA: Diagnosis not present

## 2015-09-10 DIAGNOSIS — D509 Iron deficiency anemia, unspecified: Secondary | ICD-10-CM | POA: Diagnosis not present

## 2015-09-10 DIAGNOSIS — Z0000 Encounter for general adult medical examination without abnormal findings: Secondary | ICD-10-CM | POA: Diagnosis not present

## 2015-09-10 DIAGNOSIS — N183 Chronic kidney disease, stage 3 (moderate): Secondary | ICD-10-CM | POA: Diagnosis not present

## 2015-09-10 DIAGNOSIS — I129 Hypertensive chronic kidney disease with stage 1 through stage 4 chronic kidney disease, or unspecified chronic kidney disease: Secondary | ICD-10-CM | POA: Diagnosis not present

## 2015-09-10 DIAGNOSIS — E039 Hypothyroidism, unspecified: Secondary | ICD-10-CM | POA: Diagnosis not present

## 2015-09-10 DIAGNOSIS — Z1389 Encounter for screening for other disorder: Secondary | ICD-10-CM | POA: Diagnosis not present

## 2015-09-24 DIAGNOSIS — J3089 Other allergic rhinitis: Secondary | ICD-10-CM | POA: Diagnosis not present

## 2015-09-24 DIAGNOSIS — T63451D Toxic effect of venom of hornets, accidental (unintentional), subsequent encounter: Secondary | ICD-10-CM | POA: Diagnosis not present

## 2015-10-12 DIAGNOSIS — D509 Iron deficiency anemia, unspecified: Secondary | ICD-10-CM | POA: Diagnosis not present

## 2015-10-15 DIAGNOSIS — M859 Disorder of bone density and structure, unspecified: Secondary | ICD-10-CM | POA: Diagnosis not present

## 2015-10-15 DIAGNOSIS — M8589 Other specified disorders of bone density and structure, multiple sites: Secondary | ICD-10-CM | POA: Diagnosis not present

## 2015-10-21 DIAGNOSIS — T63451D Toxic effect of venom of hornets, accidental (unintentional), subsequent encounter: Secondary | ICD-10-CM | POA: Diagnosis not present

## 2015-10-21 DIAGNOSIS — J3089 Other allergic rhinitis: Secondary | ICD-10-CM | POA: Diagnosis not present

## 2015-11-13 DIAGNOSIS — D509 Iron deficiency anemia, unspecified: Secondary | ICD-10-CM | POA: Diagnosis not present

## 2015-11-23 DIAGNOSIS — E039 Hypothyroidism, unspecified: Secondary | ICD-10-CM | POA: Diagnosis not present

## 2015-12-01 DIAGNOSIS — J3081 Allergic rhinitis due to animal (cat) (dog) hair and dander: Secondary | ICD-10-CM | POA: Diagnosis not present

## 2015-12-01 DIAGNOSIS — J3089 Other allergic rhinitis: Secondary | ICD-10-CM | POA: Diagnosis not present

## 2015-12-01 DIAGNOSIS — T63461D Toxic effect of venom of wasps, accidental (unintentional), subsequent encounter: Secondary | ICD-10-CM | POA: Diagnosis not present

## 2016-01-05 DIAGNOSIS — J3089 Other allergic rhinitis: Secondary | ICD-10-CM | POA: Diagnosis not present

## 2016-02-10 DIAGNOSIS — J3089 Other allergic rhinitis: Secondary | ICD-10-CM | POA: Diagnosis not present

## 2016-02-10 DIAGNOSIS — T63451D Toxic effect of venom of hornets, accidental (unintentional), subsequent encounter: Secondary | ICD-10-CM | POA: Diagnosis not present

## 2016-02-15 DIAGNOSIS — J3089 Other allergic rhinitis: Secondary | ICD-10-CM | POA: Diagnosis not present

## 2016-03-09 DIAGNOSIS — E7800 Pure hypercholesterolemia, unspecified: Secondary | ICD-10-CM | POA: Diagnosis not present

## 2016-03-09 DIAGNOSIS — E039 Hypothyroidism, unspecified: Secondary | ICD-10-CM | POA: Diagnosis not present

## 2016-03-09 DIAGNOSIS — N183 Chronic kidney disease, stage 3 (moderate): Secondary | ICD-10-CM | POA: Diagnosis not present

## 2016-03-09 DIAGNOSIS — K219 Gastro-esophageal reflux disease without esophagitis: Secondary | ICD-10-CM | POA: Diagnosis not present

## 2016-03-09 DIAGNOSIS — Z23 Encounter for immunization: Secondary | ICD-10-CM | POA: Diagnosis not present

## 2016-03-09 DIAGNOSIS — I129 Hypertensive chronic kidney disease with stage 1 through stage 4 chronic kidney disease, or unspecified chronic kidney disease: Secondary | ICD-10-CM | POA: Diagnosis not present

## 2016-03-09 DIAGNOSIS — D509 Iron deficiency anemia, unspecified: Secondary | ICD-10-CM | POA: Diagnosis not present

## 2016-03-17 DIAGNOSIS — T63451D Toxic effect of venom of hornets, accidental (unintentional), subsequent encounter: Secondary | ICD-10-CM | POA: Diagnosis not present

## 2016-03-17 DIAGNOSIS — J3089 Other allergic rhinitis: Secondary | ICD-10-CM | POA: Diagnosis not present

## 2016-04-15 DIAGNOSIS — Z86010 Personal history of colonic polyps: Secondary | ICD-10-CM | POA: Diagnosis not present

## 2016-04-15 DIAGNOSIS — K219 Gastro-esophageal reflux disease without esophagitis: Secondary | ICD-10-CM | POA: Diagnosis not present

## 2016-04-15 DIAGNOSIS — D509 Iron deficiency anemia, unspecified: Secondary | ICD-10-CM | POA: Diagnosis not present

## 2016-04-28 DIAGNOSIS — J3089 Other allergic rhinitis: Secondary | ICD-10-CM | POA: Diagnosis not present

## 2016-04-28 DIAGNOSIS — T63451D Toxic effect of venom of hornets, accidental (unintentional), subsequent encounter: Secondary | ICD-10-CM | POA: Diagnosis not present

## 2016-04-28 DIAGNOSIS — J3081 Allergic rhinitis due to animal (cat) (dog) hair and dander: Secondary | ICD-10-CM | POA: Diagnosis not present

## 2016-05-02 DIAGNOSIS — Z1231 Encounter for screening mammogram for malignant neoplasm of breast: Secondary | ICD-10-CM | POA: Diagnosis not present

## 2016-05-02 DIAGNOSIS — J3089 Other allergic rhinitis: Secondary | ICD-10-CM | POA: Diagnosis not present

## 2016-05-06 DIAGNOSIS — R0600 Dyspnea, unspecified: Secondary | ICD-10-CM | POA: Diagnosis not present

## 2016-05-06 DIAGNOSIS — T63441D Toxic effect of venom of bees, accidental (unintentional), subsequent encounter: Secondary | ICD-10-CM | POA: Diagnosis not present

## 2016-05-06 DIAGNOSIS — J3089 Other allergic rhinitis: Secondary | ICD-10-CM | POA: Diagnosis not present

## 2016-05-06 DIAGNOSIS — H1045 Other chronic allergic conjunctivitis: Secondary | ICD-10-CM | POA: Diagnosis not present

## 2016-05-13 DIAGNOSIS — T63451D Toxic effect of venom of hornets, accidental (unintentional), subsequent encounter: Secondary | ICD-10-CM | POA: Diagnosis not present

## 2016-05-13 DIAGNOSIS — J3089 Other allergic rhinitis: Secondary | ICD-10-CM | POA: Diagnosis not present

## 2016-05-13 DIAGNOSIS — D509 Iron deficiency anemia, unspecified: Secondary | ICD-10-CM | POA: Diagnosis not present

## 2016-05-16 DIAGNOSIS — J3089 Other allergic rhinitis: Secondary | ICD-10-CM | POA: Diagnosis not present

## 2016-05-19 DIAGNOSIS — T63451D Toxic effect of venom of hornets, accidental (unintentional), subsequent encounter: Secondary | ICD-10-CM | POA: Diagnosis not present

## 2016-05-19 DIAGNOSIS — J3089 Other allergic rhinitis: Secondary | ICD-10-CM | POA: Diagnosis not present

## 2016-05-26 DIAGNOSIS — T63451D Toxic effect of venom of hornets, accidental (unintentional), subsequent encounter: Secondary | ICD-10-CM | POA: Diagnosis not present

## 2016-06-02 DIAGNOSIS — J3089 Other allergic rhinitis: Secondary | ICD-10-CM | POA: Diagnosis not present

## 2016-06-02 DIAGNOSIS — T63451D Toxic effect of venom of hornets, accidental (unintentional), subsequent encounter: Secondary | ICD-10-CM | POA: Diagnosis not present

## 2016-06-08 DIAGNOSIS — J3089 Other allergic rhinitis: Secondary | ICD-10-CM | POA: Diagnosis not present

## 2016-07-07 DIAGNOSIS — T63451D Toxic effect of venom of hornets, accidental (unintentional), subsequent encounter: Secondary | ICD-10-CM | POA: Diagnosis not present

## 2016-07-07 DIAGNOSIS — J3089 Other allergic rhinitis: Secondary | ICD-10-CM | POA: Diagnosis not present

## 2016-08-08 DIAGNOSIS — T63451D Toxic effect of venom of hornets, accidental (unintentional), subsequent encounter: Secondary | ICD-10-CM | POA: Diagnosis not present

## 2016-08-08 DIAGNOSIS — J3089 Other allergic rhinitis: Secondary | ICD-10-CM | POA: Diagnosis not present

## 2016-09-07 DIAGNOSIS — T63451D Toxic effect of venom of hornets, accidental (unintentional), subsequent encounter: Secondary | ICD-10-CM | POA: Diagnosis not present

## 2016-09-07 DIAGNOSIS — J3089 Other allergic rhinitis: Secondary | ICD-10-CM | POA: Diagnosis not present

## 2016-09-12 DIAGNOSIS — M8580 Other specified disorders of bone density and structure, unspecified site: Secondary | ICD-10-CM | POA: Diagnosis not present

## 2016-09-12 DIAGNOSIS — E7800 Pure hypercholesterolemia, unspecified: Secondary | ICD-10-CM | POA: Diagnosis not present

## 2016-09-12 DIAGNOSIS — D509 Iron deficiency anemia, unspecified: Secondary | ICD-10-CM | POA: Diagnosis not present

## 2016-09-12 DIAGNOSIS — Z0000 Encounter for general adult medical examination without abnormal findings: Secondary | ICD-10-CM | POA: Diagnosis not present

## 2016-09-12 DIAGNOSIS — E039 Hypothyroidism, unspecified: Secondary | ICD-10-CM | POA: Diagnosis not present

## 2016-09-12 DIAGNOSIS — N183 Chronic kidney disease, stage 3 (moderate): Secondary | ICD-10-CM | POA: Diagnosis not present

## 2016-09-12 DIAGNOSIS — I129 Hypertensive chronic kidney disease with stage 1 through stage 4 chronic kidney disease, or unspecified chronic kidney disease: Secondary | ICD-10-CM | POA: Diagnosis not present

## 2016-09-12 DIAGNOSIS — K219 Gastro-esophageal reflux disease without esophagitis: Secondary | ICD-10-CM | POA: Diagnosis not present

## 2016-09-12 DIAGNOSIS — Z1389 Encounter for screening for other disorder: Secondary | ICD-10-CM | POA: Diagnosis not present

## 2016-10-10 DIAGNOSIS — T63451D Toxic effect of venom of hornets, accidental (unintentional), subsequent encounter: Secondary | ICD-10-CM | POA: Diagnosis not present

## 2016-10-10 DIAGNOSIS — J3089 Other allergic rhinitis: Secondary | ICD-10-CM | POA: Diagnosis not present

## 2016-11-10 DIAGNOSIS — R0602 Shortness of breath: Secondary | ICD-10-CM | POA: Diagnosis not present

## 2016-11-10 DIAGNOSIS — T63441D Toxic effect of venom of bees, accidental (unintentional), subsequent encounter: Secondary | ICD-10-CM | POA: Diagnosis not present

## 2016-11-10 DIAGNOSIS — H1045 Other chronic allergic conjunctivitis: Secondary | ICD-10-CM | POA: Diagnosis not present

## 2016-11-10 DIAGNOSIS — J3089 Other allergic rhinitis: Secondary | ICD-10-CM | POA: Diagnosis not present

## 2016-11-15 DIAGNOSIS — J3089 Other allergic rhinitis: Secondary | ICD-10-CM | POA: Diagnosis not present

## 2016-12-26 DIAGNOSIS — T63451D Toxic effect of venom of hornets, accidental (unintentional), subsequent encounter: Secondary | ICD-10-CM | POA: Diagnosis not present

## 2016-12-26 DIAGNOSIS — J301 Allergic rhinitis due to pollen: Secondary | ICD-10-CM | POA: Diagnosis not present

## 2016-12-26 DIAGNOSIS — J3089 Other allergic rhinitis: Secondary | ICD-10-CM | POA: Diagnosis not present

## 2017-01-20 DIAGNOSIS — T63451D Toxic effect of venom of hornets, accidental (unintentional), subsequent encounter: Secondary | ICD-10-CM | POA: Diagnosis not present

## 2017-01-20 DIAGNOSIS — J3089 Other allergic rhinitis: Secondary | ICD-10-CM | POA: Diagnosis not present

## 2017-01-24 DIAGNOSIS — D509 Iron deficiency anemia, unspecified: Secondary | ICD-10-CM | POA: Diagnosis not present

## 2017-02-24 DIAGNOSIS — T63451D Toxic effect of venom of hornets, accidental (unintentional), subsequent encounter: Secondary | ICD-10-CM | POA: Diagnosis not present

## 2017-02-24 DIAGNOSIS — J3089 Other allergic rhinitis: Secondary | ICD-10-CM | POA: Diagnosis not present

## 2017-02-27 DIAGNOSIS — J3089 Other allergic rhinitis: Secondary | ICD-10-CM | POA: Diagnosis not present

## 2017-03-08 DIAGNOSIS — Z6829 Body mass index (BMI) 29.0-29.9, adult: Secondary | ICD-10-CM | POA: Diagnosis not present

## 2017-03-08 DIAGNOSIS — E7800 Pure hypercholesterolemia, unspecified: Secondary | ICD-10-CM | POA: Diagnosis not present

## 2017-03-08 DIAGNOSIS — I129 Hypertensive chronic kidney disease with stage 1 through stage 4 chronic kidney disease, or unspecified chronic kidney disease: Secondary | ICD-10-CM | POA: Diagnosis not present

## 2017-03-08 DIAGNOSIS — N183 Chronic kidney disease, stage 3 (moderate): Secondary | ICD-10-CM | POA: Diagnosis not present

## 2017-03-08 DIAGNOSIS — K219 Gastro-esophageal reflux disease without esophagitis: Secondary | ICD-10-CM | POA: Diagnosis not present

## 2017-03-21 DIAGNOSIS — H04123 Dry eye syndrome of bilateral lacrimal glands: Secondary | ICD-10-CM | POA: Diagnosis not present

## 2017-03-21 DIAGNOSIS — H26492 Other secondary cataract, left eye: Secondary | ICD-10-CM | POA: Diagnosis not present

## 2017-03-21 DIAGNOSIS — H52203 Unspecified astigmatism, bilateral: Secondary | ICD-10-CM | POA: Diagnosis not present

## 2017-03-21 DIAGNOSIS — H43813 Vitreous degeneration, bilateral: Secondary | ICD-10-CM | POA: Diagnosis not present

## 2017-03-30 DIAGNOSIS — T63451D Toxic effect of venom of hornets, accidental (unintentional), subsequent encounter: Secondary | ICD-10-CM | POA: Diagnosis not present

## 2017-03-30 DIAGNOSIS — J3089 Other allergic rhinitis: Secondary | ICD-10-CM | POA: Diagnosis not present

## 2017-04-05 DIAGNOSIS — J3089 Other allergic rhinitis: Secondary | ICD-10-CM | POA: Diagnosis not present

## 2017-04-07 DIAGNOSIS — J3089 Other allergic rhinitis: Secondary | ICD-10-CM | POA: Diagnosis not present

## 2017-04-18 DIAGNOSIS — J3089 Other allergic rhinitis: Secondary | ICD-10-CM | POA: Diagnosis not present

## 2017-04-21 DIAGNOSIS — J3089 Other allergic rhinitis: Secondary | ICD-10-CM | POA: Diagnosis not present

## 2017-04-26 DIAGNOSIS — J3089 Other allergic rhinitis: Secondary | ICD-10-CM | POA: Diagnosis not present

## 2017-04-28 DIAGNOSIS — T63451D Toxic effect of venom of hornets, accidental (unintentional), subsequent encounter: Secondary | ICD-10-CM | POA: Diagnosis not present

## 2017-04-30 DIAGNOSIS — Z23 Encounter for immunization: Secondary | ICD-10-CM | POA: Diagnosis not present

## 2017-05-08 DIAGNOSIS — Z1231 Encounter for screening mammogram for malignant neoplasm of breast: Secondary | ICD-10-CM | POA: Diagnosis not present

## 2017-05-31 DIAGNOSIS — J3089 Other allergic rhinitis: Secondary | ICD-10-CM | POA: Diagnosis not present

## 2017-05-31 DIAGNOSIS — T63451D Toxic effect of venom of hornets, accidental (unintentional), subsequent encounter: Secondary | ICD-10-CM | POA: Diagnosis not present

## 2017-08-11 DIAGNOSIS — R062 Wheezing: Secondary | ICD-10-CM | POA: Diagnosis not present

## 2017-08-11 DIAGNOSIS — H1045 Other chronic allergic conjunctivitis: Secondary | ICD-10-CM | POA: Diagnosis not present

## 2017-08-11 DIAGNOSIS — T63441D Toxic effect of venom of bees, accidental (unintentional), subsequent encounter: Secondary | ICD-10-CM | POA: Diagnosis not present

## 2017-08-11 DIAGNOSIS — J3089 Other allergic rhinitis: Secondary | ICD-10-CM | POA: Diagnosis not present

## 2017-08-21 DIAGNOSIS — T63451D Toxic effect of venom of hornets, accidental (unintentional), subsequent encounter: Secondary | ICD-10-CM | POA: Diagnosis not present

## 2017-08-28 DIAGNOSIS — T63451D Toxic effect of venom of hornets, accidental (unintentional), subsequent encounter: Secondary | ICD-10-CM | POA: Diagnosis not present

## 2017-09-04 DIAGNOSIS — T63451D Toxic effect of venom of hornets, accidental (unintentional), subsequent encounter: Secondary | ICD-10-CM | POA: Diagnosis not present

## 2017-09-04 DIAGNOSIS — J3089 Other allergic rhinitis: Secondary | ICD-10-CM | POA: Diagnosis not present

## 2017-09-11 DIAGNOSIS — J3089 Other allergic rhinitis: Secondary | ICD-10-CM | POA: Diagnosis not present

## 2017-09-11 DIAGNOSIS — T63451D Toxic effect of venom of hornets, accidental (unintentional), subsequent encounter: Secondary | ICD-10-CM | POA: Diagnosis not present

## 2017-09-18 DIAGNOSIS — D509 Iron deficiency anemia, unspecified: Secondary | ICD-10-CM | POA: Diagnosis not present

## 2017-09-18 DIAGNOSIS — K219 Gastro-esophageal reflux disease without esophagitis: Secondary | ICD-10-CM | POA: Diagnosis not present

## 2017-09-18 DIAGNOSIS — J3089 Other allergic rhinitis: Secondary | ICD-10-CM | POA: Diagnosis not present

## 2017-09-18 DIAGNOSIS — E039 Hypothyroidism, unspecified: Secondary | ICD-10-CM | POA: Diagnosis not present

## 2017-09-18 DIAGNOSIS — Z0000 Encounter for general adult medical examination without abnormal findings: Secondary | ICD-10-CM | POA: Diagnosis not present

## 2017-09-18 DIAGNOSIS — E7800 Pure hypercholesterolemia, unspecified: Secondary | ICD-10-CM | POA: Diagnosis not present

## 2017-09-18 DIAGNOSIS — I129 Hypertensive chronic kidney disease with stage 1 through stage 4 chronic kidney disease, or unspecified chronic kidney disease: Secondary | ICD-10-CM | POA: Diagnosis not present

## 2017-09-18 DIAGNOSIS — Z85038 Personal history of other malignant neoplasm of large intestine: Secondary | ICD-10-CM | POA: Diagnosis not present

## 2017-09-18 DIAGNOSIS — Z6829 Body mass index (BMI) 29.0-29.9, adult: Secondary | ICD-10-CM | POA: Diagnosis not present

## 2017-09-18 DIAGNOSIS — E2839 Other primary ovarian failure: Secondary | ICD-10-CM | POA: Diagnosis not present

## 2017-09-18 DIAGNOSIS — N183 Chronic kidney disease, stage 3 (moderate): Secondary | ICD-10-CM | POA: Diagnosis not present

## 2017-09-18 DIAGNOSIS — Z1389 Encounter for screening for other disorder: Secondary | ICD-10-CM | POA: Diagnosis not present

## 2017-09-18 DIAGNOSIS — M8580 Other specified disorders of bone density and structure, unspecified site: Secondary | ICD-10-CM | POA: Diagnosis not present

## 2017-09-25 DIAGNOSIS — J3089 Other allergic rhinitis: Secondary | ICD-10-CM | POA: Diagnosis not present

## 2017-09-27 DIAGNOSIS — J3089 Other allergic rhinitis: Secondary | ICD-10-CM | POA: Diagnosis not present

## 2017-09-29 DIAGNOSIS — J3089 Other allergic rhinitis: Secondary | ICD-10-CM | POA: Diagnosis not present

## 2017-10-02 DIAGNOSIS — J3089 Other allergic rhinitis: Secondary | ICD-10-CM | POA: Diagnosis not present

## 2017-10-04 DIAGNOSIS — J3089 Other allergic rhinitis: Secondary | ICD-10-CM | POA: Diagnosis not present

## 2017-10-09 DIAGNOSIS — J3089 Other allergic rhinitis: Secondary | ICD-10-CM | POA: Diagnosis not present

## 2017-10-11 DIAGNOSIS — J3089 Other allergic rhinitis: Secondary | ICD-10-CM | POA: Diagnosis not present

## 2017-10-16 DIAGNOSIS — J3089 Other allergic rhinitis: Secondary | ICD-10-CM | POA: Diagnosis not present

## 2017-10-18 DIAGNOSIS — T63451D Toxic effect of venom of hornets, accidental (unintentional), subsequent encounter: Secondary | ICD-10-CM | POA: Diagnosis not present

## 2017-10-20 DIAGNOSIS — J3089 Other allergic rhinitis: Secondary | ICD-10-CM | POA: Diagnosis not present

## 2017-10-23 DIAGNOSIS — T63451D Toxic effect of venom of hornets, accidental (unintentional), subsequent encounter: Secondary | ICD-10-CM | POA: Diagnosis not present

## 2017-10-25 DIAGNOSIS — J3089 Other allergic rhinitis: Secondary | ICD-10-CM | POA: Diagnosis not present

## 2017-10-31 DIAGNOSIS — J3089 Other allergic rhinitis: Secondary | ICD-10-CM | POA: Diagnosis not present

## 2017-10-31 DIAGNOSIS — J301 Allergic rhinitis due to pollen: Secondary | ICD-10-CM | POA: Diagnosis not present

## 2017-11-07 DIAGNOSIS — J3089 Other allergic rhinitis: Secondary | ICD-10-CM | POA: Diagnosis not present

## 2017-11-13 DIAGNOSIS — H1045 Other chronic allergic conjunctivitis: Secondary | ICD-10-CM | POA: Diagnosis not present

## 2017-11-13 DIAGNOSIS — R0602 Shortness of breath: Secondary | ICD-10-CM | POA: Diagnosis not present

## 2017-11-13 DIAGNOSIS — J3089 Other allergic rhinitis: Secondary | ICD-10-CM | POA: Diagnosis not present

## 2017-11-13 DIAGNOSIS — T63441D Toxic effect of venom of bees, accidental (unintentional), subsequent encounter: Secondary | ICD-10-CM | POA: Diagnosis not present

## 2017-11-14 DIAGNOSIS — E2839 Other primary ovarian failure: Secondary | ICD-10-CM | POA: Diagnosis not present

## 2017-11-14 DIAGNOSIS — M8588 Other specified disorders of bone density and structure, other site: Secondary | ICD-10-CM | POA: Diagnosis not present

## 2017-11-18 DIAGNOSIS — S50811A Abrasion of right forearm, initial encounter: Secondary | ICD-10-CM | POA: Diagnosis not present

## 2017-11-18 DIAGNOSIS — W57XXXA Bitten or stung by nonvenomous insect and other nonvenomous arthropods, initial encounter: Secondary | ICD-10-CM | POA: Diagnosis not present

## 2017-11-18 DIAGNOSIS — W19XXXA Unspecified fall, initial encounter: Secondary | ICD-10-CM | POA: Diagnosis not present

## 2017-11-18 DIAGNOSIS — S50862A Insect bite (nonvenomous) of left forearm, initial encounter: Secondary | ICD-10-CM | POA: Diagnosis not present

## 2017-11-22 DIAGNOSIS — T63451D Toxic effect of venom of hornets, accidental (unintentional), subsequent encounter: Secondary | ICD-10-CM | POA: Diagnosis not present

## 2017-12-07 DIAGNOSIS — J301 Allergic rhinitis due to pollen: Secondary | ICD-10-CM | POA: Diagnosis not present

## 2017-12-07 DIAGNOSIS — J3089 Other allergic rhinitis: Secondary | ICD-10-CM | POA: Diagnosis not present

## 2017-12-22 DIAGNOSIS — T63451D Toxic effect of venom of hornets, accidental (unintentional), subsequent encounter: Secondary | ICD-10-CM | POA: Diagnosis not present

## 2018-01-12 ENCOUNTER — Ambulatory Visit
Admission: RE | Admit: 2018-01-12 | Discharge: 2018-01-12 | Disposition: A | Discharge status: Home / Self Care | Payer: Medicare Other | Source: Ambulatory Visit | Attending: Allergy and Immunology | Admitting: Allergy and Immunology

## 2018-01-12 ENCOUNTER — Other Ambulatory Visit: Payer: Self-pay | Admitting: Allergy and Immunology

## 2018-01-12 DIAGNOSIS — T63441D Toxic effect of venom of bees, accidental (unintentional), subsequent encounter: Secondary | ICD-10-CM | POA: Diagnosis not present

## 2018-01-12 DIAGNOSIS — J3089 Other allergic rhinitis: Secondary | ICD-10-CM | POA: Diagnosis not present

## 2018-01-12 DIAGNOSIS — R0602 Shortness of breath: Secondary | ICD-10-CM | POA: Diagnosis not present

## 2018-01-12 DIAGNOSIS — H1045 Other chronic allergic conjunctivitis: Secondary | ICD-10-CM | POA: Diagnosis not present

## 2018-01-12 DIAGNOSIS — R0789 Other chest pain: Secondary | ICD-10-CM | POA: Diagnosis not present

## 2018-01-22 DIAGNOSIS — J3089 Other allergic rhinitis: Secondary | ICD-10-CM | POA: Diagnosis not present

## 2018-01-26 DIAGNOSIS — N183 Chronic kidney disease, stage 3 (moderate): Secondary | ICD-10-CM | POA: Diagnosis not present

## 2018-01-26 DIAGNOSIS — I700 Atherosclerosis of aorta: Secondary | ICD-10-CM | POA: Diagnosis not present

## 2018-01-26 DIAGNOSIS — E7800 Pure hypercholesterolemia, unspecified: Secondary | ICD-10-CM | POA: Diagnosis not present

## 2018-01-31 DIAGNOSIS — T63451D Toxic effect of venom of hornets, accidental (unintentional), subsequent encounter: Secondary | ICD-10-CM | POA: Diagnosis not present

## 2018-02-16 DIAGNOSIS — J3089 Other allergic rhinitis: Secondary | ICD-10-CM | POA: Diagnosis not present

## 2018-03-02 DIAGNOSIS — T63451D Toxic effect of venom of hornets, accidental (unintentional), subsequent encounter: Secondary | ICD-10-CM | POA: Diagnosis not present

## 2018-03-08 DIAGNOSIS — J3089 Other allergic rhinitis: Secondary | ICD-10-CM | POA: Diagnosis not present

## 2018-03-30 DIAGNOSIS — J3089 Other allergic rhinitis: Secondary | ICD-10-CM | POA: Diagnosis not present

## 2018-04-09 DIAGNOSIS — T63451D Toxic effect of venom of hornets, accidental (unintentional), subsequent encounter: Secondary | ICD-10-CM | POA: Diagnosis not present

## 2018-04-11 DIAGNOSIS — I10 Essential (primary) hypertension: Secondary | ICD-10-CM | POA: Diagnosis not present

## 2018-04-11 DIAGNOSIS — H00021 Hordeolum internum right upper eyelid: Secondary | ICD-10-CM | POA: Diagnosis not present

## 2018-04-11 DIAGNOSIS — H6121 Impacted cerumen, right ear: Secondary | ICD-10-CM | POA: Diagnosis not present

## 2018-04-11 DIAGNOSIS — Z23 Encounter for immunization: Secondary | ICD-10-CM | POA: Diagnosis not present

## 2018-05-07 DIAGNOSIS — T63451D Toxic effect of venom of hornets, accidental (unintentional), subsequent encounter: Secondary | ICD-10-CM | POA: Diagnosis not present

## 2018-05-07 DIAGNOSIS — T63441D Toxic effect of venom of bees, accidental (unintentional), subsequent encounter: Secondary | ICD-10-CM | POA: Diagnosis not present

## 2018-05-07 DIAGNOSIS — J3089 Other allergic rhinitis: Secondary | ICD-10-CM | POA: Diagnosis not present

## 2018-05-07 DIAGNOSIS — T63461D Toxic effect of venom of wasps, accidental (unintentional), subsequent encounter: Secondary | ICD-10-CM | POA: Diagnosis not present

## 2018-05-09 DIAGNOSIS — J3089 Other allergic rhinitis: Secondary | ICD-10-CM | POA: Diagnosis not present

## 2018-05-10 DIAGNOSIS — Z1231 Encounter for screening mammogram for malignant neoplasm of breast: Secondary | ICD-10-CM | POA: Diagnosis not present

## 2018-05-10 DIAGNOSIS — Z803 Family history of malignant neoplasm of breast: Secondary | ICD-10-CM | POA: Diagnosis not present

## 2018-05-14 DIAGNOSIS — J3089 Other allergic rhinitis: Secondary | ICD-10-CM | POA: Diagnosis not present

## 2018-05-17 DIAGNOSIS — J3089 Other allergic rhinitis: Secondary | ICD-10-CM | POA: Diagnosis not present

## 2018-05-21 DIAGNOSIS — J3089 Other allergic rhinitis: Secondary | ICD-10-CM | POA: Diagnosis not present

## 2018-06-06 DIAGNOSIS — T63451D Toxic effect of venom of hornets, accidental (unintentional), subsequent encounter: Secondary | ICD-10-CM | POA: Diagnosis not present

## 2018-06-06 DIAGNOSIS — T63441D Toxic effect of venom of bees, accidental (unintentional), subsequent encounter: Secondary | ICD-10-CM | POA: Diagnosis not present

## 2018-06-06 DIAGNOSIS — T63461D Toxic effect of venom of wasps, accidental (unintentional), subsequent encounter: Secondary | ICD-10-CM | POA: Diagnosis not present

## 2018-06-27 DIAGNOSIS — J3089 Other allergic rhinitis: Secondary | ICD-10-CM | POA: Diagnosis not present

## 2018-06-29 DIAGNOSIS — H524 Presbyopia: Secondary | ICD-10-CM | POA: Diagnosis not present

## 2018-06-29 DIAGNOSIS — H43813 Vitreous degeneration, bilateral: Secondary | ICD-10-CM | POA: Diagnosis not present

## 2018-06-29 DIAGNOSIS — H26492 Other secondary cataract, left eye: Secondary | ICD-10-CM | POA: Diagnosis not present

## 2018-06-29 DIAGNOSIS — H04123 Dry eye syndrome of bilateral lacrimal glands: Secondary | ICD-10-CM | POA: Diagnosis not present

## 2018-07-04 DIAGNOSIS — T63451D Toxic effect of venom of hornets, accidental (unintentional), subsequent encounter: Secondary | ICD-10-CM | POA: Diagnosis not present

## 2018-07-04 DIAGNOSIS — T63461D Toxic effect of venom of wasps, accidental (unintentional), subsequent encounter: Secondary | ICD-10-CM | POA: Diagnosis not present

## 2018-07-04 DIAGNOSIS — T63441D Toxic effect of venom of bees, accidental (unintentional), subsequent encounter: Secondary | ICD-10-CM | POA: Diagnosis not present

## 2018-07-30 DIAGNOSIS — J3089 Other allergic rhinitis: Secondary | ICD-10-CM | POA: Diagnosis not present

## 2018-08-01 DIAGNOSIS — T63441D Toxic effect of venom of bees, accidental (unintentional), subsequent encounter: Secondary | ICD-10-CM | POA: Diagnosis not present

## 2018-08-01 DIAGNOSIS — T63451D Toxic effect of venom of hornets, accidental (unintentional), subsequent encounter: Secondary | ICD-10-CM | POA: Diagnosis not present

## 2018-08-01 DIAGNOSIS — T63461D Toxic effect of venom of wasps, accidental (unintentional), subsequent encounter: Secondary | ICD-10-CM | POA: Diagnosis not present

## 2018-08-10 IMAGING — CR DG CHEST 2V
2 series · 2 of 2 positions shown · non-contrast
Comparison: 08/22/2014

CLINICAL DATA: Right chest wall pain.

EXAM:
CHEST - 2 VIEW

[w chest pa]
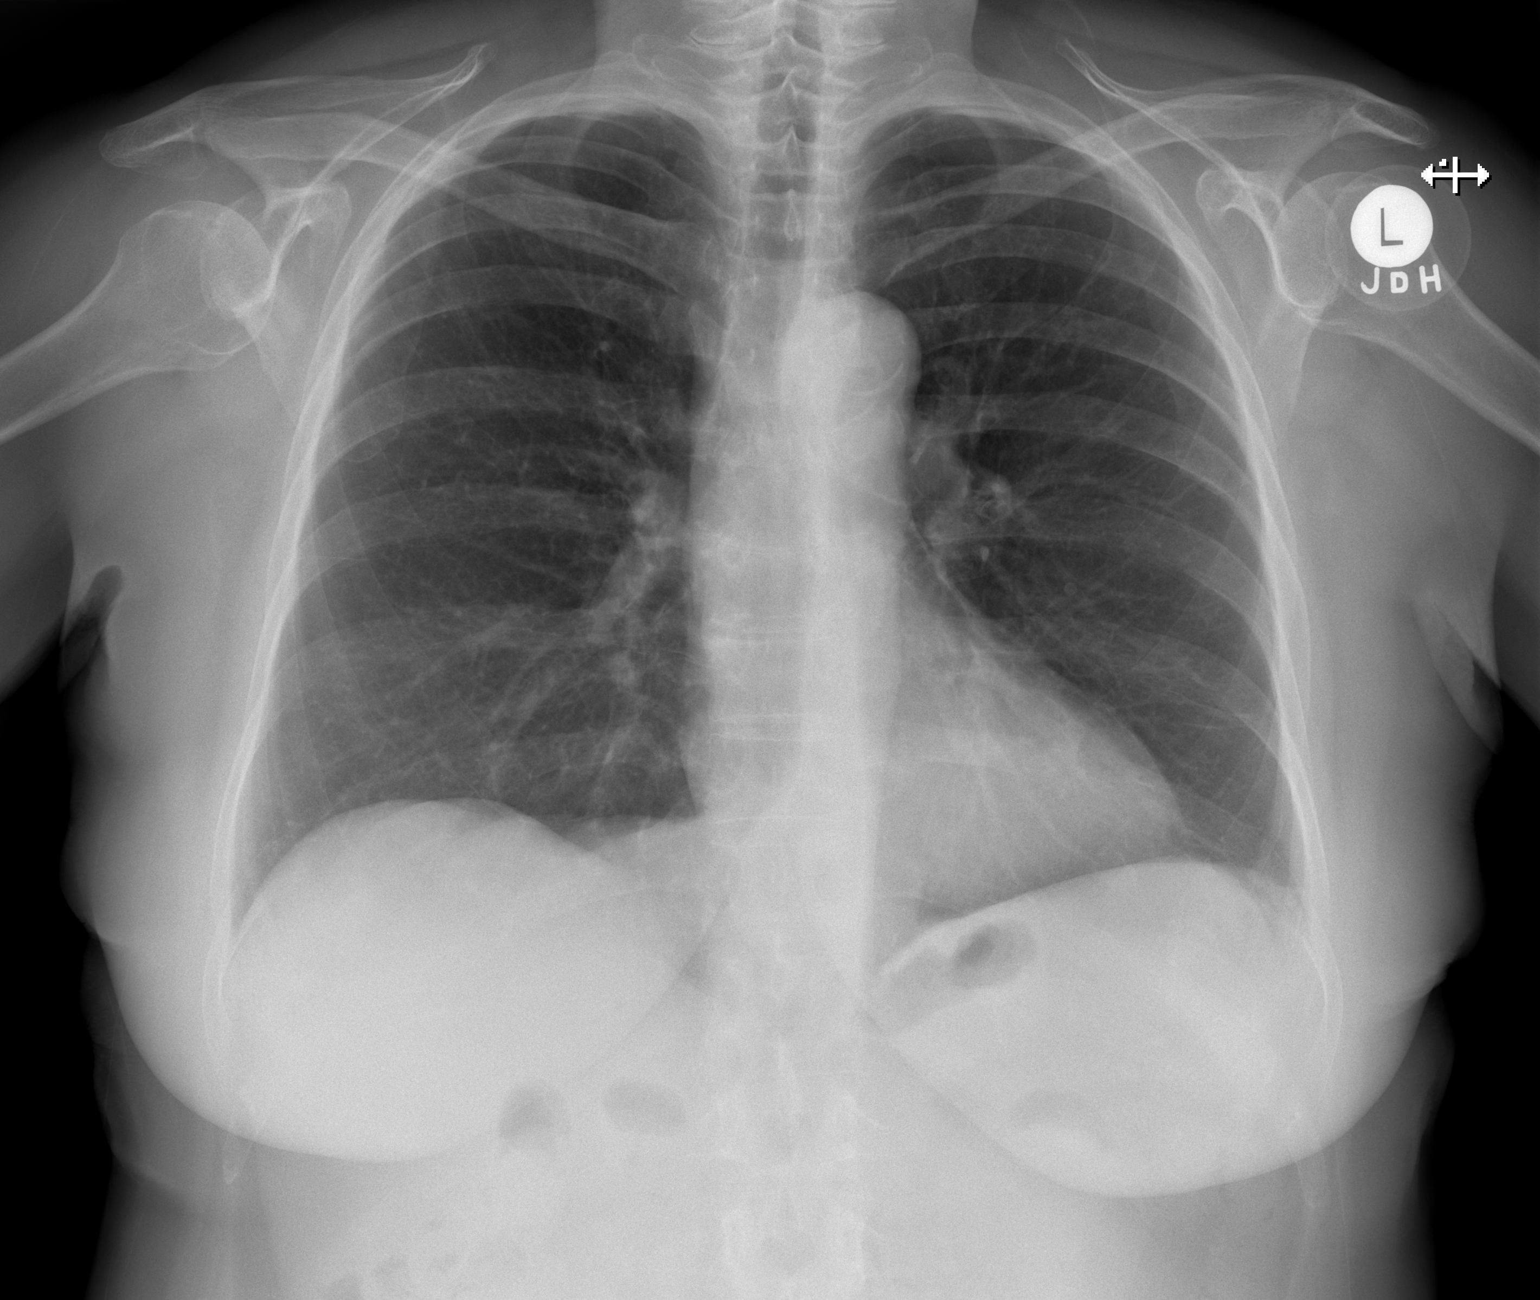

[w chest lat]
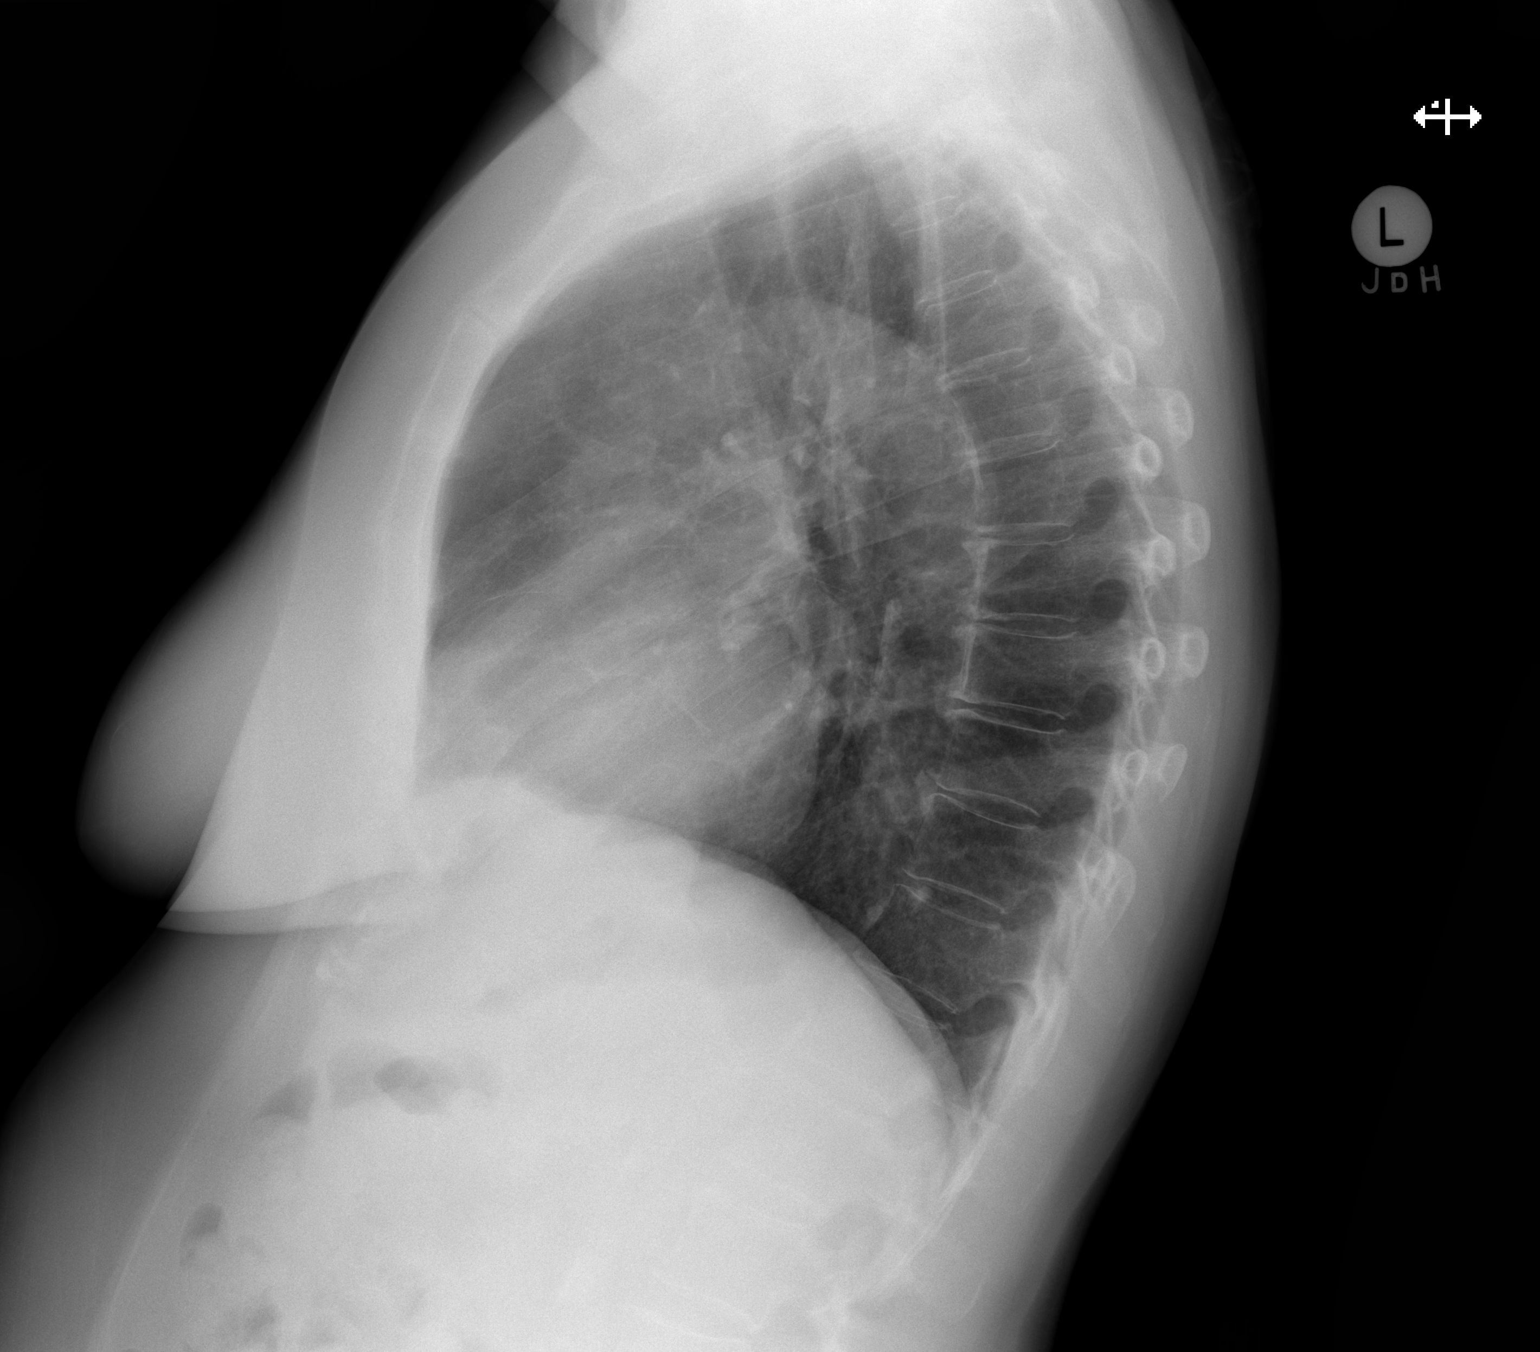

[2 of 2 positions shown; findings below may reference images not displayed]

FINDINGS: Cardiomediastinal silhouette is normal. Mediastinal contours appear
intact. Calcific atherosclerotic disease of the aorta.

There is no evidence of focal airspace consolidation, pleural
effusion or pneumothorax.

Osseous structures are without acute abnormality. Soft tissues are
grossly normal.
IMPRESSION: No active cardiopulmonary disease.

Calcific atherosclerotic disease of the aorta.

## 2018-08-31 DIAGNOSIS — J019 Acute sinusitis, unspecified: Secondary | ICD-10-CM | POA: Diagnosis not present

## 2018-08-31 DIAGNOSIS — H1045 Other chronic allergic conjunctivitis: Secondary | ICD-10-CM | POA: Diagnosis not present

## 2018-08-31 DIAGNOSIS — J3089 Other allergic rhinitis: Secondary | ICD-10-CM | POA: Diagnosis not present

## 2018-08-31 DIAGNOSIS — R0602 Shortness of breath: Secondary | ICD-10-CM | POA: Diagnosis not present

## 2018-09-08 DIAGNOSIS — Z85038 Personal history of other malignant neoplasm of large intestine: Secondary | ICD-10-CM | POA: Diagnosis not present

## 2018-09-08 DIAGNOSIS — Z79899 Other long term (current) drug therapy: Secondary | ICD-10-CM | POA: Diagnosis not present

## 2018-09-08 DIAGNOSIS — Z7982 Long term (current) use of aspirin: Secondary | ICD-10-CM | POA: Diagnosis not present

## 2018-09-08 DIAGNOSIS — E039 Hypothyroidism, unspecified: Secondary | ICD-10-CM | POA: Diagnosis not present

## 2018-09-08 DIAGNOSIS — K219 Gastro-esophageal reflux disease without esophagitis: Secondary | ICD-10-CM | POA: Diagnosis not present

## 2018-09-08 DIAGNOSIS — R9431 Abnormal electrocardiogram [ECG] [EKG]: Secondary | ICD-10-CM | POA: Diagnosis not present

## 2018-09-08 DIAGNOSIS — I10 Essential (primary) hypertension: Secondary | ICD-10-CM | POA: Diagnosis not present

## 2018-09-08 DIAGNOSIS — R1111 Vomiting without nausea: Secondary | ICD-10-CM | POA: Diagnosis not present

## 2018-09-08 DIAGNOSIS — R55 Syncope and collapse: Secondary | ICD-10-CM | POA: Diagnosis not present

## 2018-09-08 DIAGNOSIS — R42 Dizziness and giddiness: Secondary | ICD-10-CM | POA: Diagnosis not present

## 2018-09-09 DIAGNOSIS — Z85038 Personal history of other malignant neoplasm of large intestine: Secondary | ICD-10-CM | POA: Diagnosis not present

## 2018-09-09 DIAGNOSIS — K219 Gastro-esophageal reflux disease without esophagitis: Secondary | ICD-10-CM | POA: Diagnosis not present

## 2018-09-09 DIAGNOSIS — E039 Hypothyroidism, unspecified: Secondary | ICD-10-CM | POA: Diagnosis not present

## 2018-09-09 DIAGNOSIS — I10 Essential (primary) hypertension: Secondary | ICD-10-CM | POA: Diagnosis not present

## 2018-09-09 DIAGNOSIS — R55 Syncope and collapse: Secondary | ICD-10-CM | POA: Diagnosis not present

## 2018-09-12 DIAGNOSIS — J3089 Other allergic rhinitis: Secondary | ICD-10-CM | POA: Diagnosis not present

## 2018-09-17 DIAGNOSIS — J3089 Other allergic rhinitis: Secondary | ICD-10-CM | POA: Diagnosis not present

## 2018-09-17 DIAGNOSIS — T63441D Toxic effect of venom of bees, accidental (unintentional), subsequent encounter: Secondary | ICD-10-CM | POA: Diagnosis not present

## 2018-09-17 DIAGNOSIS — T63451D Toxic effect of venom of hornets, accidental (unintentional), subsequent encounter: Secondary | ICD-10-CM | POA: Diagnosis not present

## 2018-09-17 DIAGNOSIS — T63461D Toxic effect of venom of wasps, accidental (unintentional), subsequent encounter: Secondary | ICD-10-CM | POA: Diagnosis not present

## 2018-09-24 DIAGNOSIS — T63451D Toxic effect of venom of hornets, accidental (unintentional), subsequent encounter: Secondary | ICD-10-CM | POA: Diagnosis not present

## 2018-09-26 DIAGNOSIS — Z85038 Personal history of other malignant neoplasm of large intestine: Secondary | ICD-10-CM | POA: Diagnosis not present

## 2018-09-26 DIAGNOSIS — M8580 Other specified disorders of bone density and structure, unspecified site: Secondary | ICD-10-CM | POA: Diagnosis not present

## 2018-09-26 DIAGNOSIS — E039 Hypothyroidism, unspecified: Secondary | ICD-10-CM | POA: Diagnosis not present

## 2018-09-26 DIAGNOSIS — I700 Atherosclerosis of aorta: Secondary | ICD-10-CM | POA: Diagnosis not present

## 2018-09-26 DIAGNOSIS — E7800 Pure hypercholesterolemia, unspecified: Secondary | ICD-10-CM | POA: Diagnosis not present

## 2018-09-26 DIAGNOSIS — D509 Iron deficiency anemia, unspecified: Secondary | ICD-10-CM | POA: Diagnosis not present

## 2018-09-26 DIAGNOSIS — Z0000 Encounter for general adult medical examination without abnormal findings: Secondary | ICD-10-CM | POA: Diagnosis not present

## 2018-09-26 DIAGNOSIS — Z6829 Body mass index (BMI) 29.0-29.9, adult: Secondary | ICD-10-CM | POA: Diagnosis not present

## 2018-09-26 DIAGNOSIS — K219 Gastro-esophageal reflux disease without esophagitis: Secondary | ICD-10-CM | POA: Diagnosis not present

## 2018-09-26 DIAGNOSIS — N183 Chronic kidney disease, stage 3 (moderate): Secondary | ICD-10-CM | POA: Diagnosis not present

## 2018-09-26 DIAGNOSIS — I129 Hypertensive chronic kidney disease with stage 1 through stage 4 chronic kidney disease, or unspecified chronic kidney disease: Secondary | ICD-10-CM | POA: Diagnosis not present

## 2018-09-26 DIAGNOSIS — Z1389 Encounter for screening for other disorder: Secondary | ICD-10-CM | POA: Diagnosis not present

## 2018-10-01 DIAGNOSIS — T63451D Toxic effect of venom of hornets, accidental (unintentional), subsequent encounter: Secondary | ICD-10-CM | POA: Diagnosis not present

## 2018-10-08 DIAGNOSIS — T63441D Toxic effect of venom of bees, accidental (unintentional), subsequent encounter: Secondary | ICD-10-CM | POA: Diagnosis not present

## 2018-10-08 DIAGNOSIS — T63451D Toxic effect of venom of hornets, accidental (unintentional), subsequent encounter: Secondary | ICD-10-CM | POA: Diagnosis not present

## 2018-10-08 DIAGNOSIS — T63461D Toxic effect of venom of wasps, accidental (unintentional), subsequent encounter: Secondary | ICD-10-CM | POA: Diagnosis not present

## 2018-10-15 DIAGNOSIS — J3089 Other allergic rhinitis: Secondary | ICD-10-CM | POA: Diagnosis not present

## 2018-10-16 DIAGNOSIS — N183 Chronic kidney disease, stage 3 (moderate): Secondary | ICD-10-CM | POA: Diagnosis not present

## 2018-11-13 DIAGNOSIS — T63451D Toxic effect of venom of hornets, accidental (unintentional), subsequent encounter: Secondary | ICD-10-CM | POA: Diagnosis not present

## 2018-11-13 DIAGNOSIS — J3089 Other allergic rhinitis: Secondary | ICD-10-CM | POA: Diagnosis not present

## 2018-11-23 DIAGNOSIS — N183 Chronic kidney disease, stage 3 (moderate): Secondary | ICD-10-CM | POA: Diagnosis not present

## 2018-11-23 DIAGNOSIS — M8580 Other specified disorders of bone density and structure, unspecified site: Secondary | ICD-10-CM | POA: Diagnosis not present

## 2018-11-23 DIAGNOSIS — D509 Iron deficiency anemia, unspecified: Secondary | ICD-10-CM | POA: Diagnosis not present

## 2018-11-23 DIAGNOSIS — E782 Mixed hyperlipidemia: Secondary | ICD-10-CM | POA: Diagnosis not present

## 2018-11-23 DIAGNOSIS — E7800 Pure hypercholesterolemia, unspecified: Secondary | ICD-10-CM | POA: Diagnosis not present

## 2018-11-23 DIAGNOSIS — Z85038 Personal history of other malignant neoplasm of large intestine: Secondary | ICD-10-CM | POA: Diagnosis not present

## 2018-11-23 DIAGNOSIS — I129 Hypertensive chronic kidney disease with stage 1 through stage 4 chronic kidney disease, or unspecified chronic kidney disease: Secondary | ICD-10-CM | POA: Diagnosis not present

## 2018-11-23 DIAGNOSIS — M189 Osteoarthritis of first carpometacarpal joint, unspecified: Secondary | ICD-10-CM | POA: Diagnosis not present

## 2018-11-23 DIAGNOSIS — E039 Hypothyroidism, unspecified: Secondary | ICD-10-CM | POA: Diagnosis not present

## 2018-11-26 DIAGNOSIS — T63451D Toxic effect of venom of hornets, accidental (unintentional), subsequent encounter: Secondary | ICD-10-CM | POA: Diagnosis not present

## 2018-12-03 DIAGNOSIS — T63451D Toxic effect of venom of hornets, accidental (unintentional), subsequent encounter: Secondary | ICD-10-CM | POA: Diagnosis not present

## 2018-12-14 DIAGNOSIS — J3089 Other allergic rhinitis: Secondary | ICD-10-CM | POA: Diagnosis not present

## 2019-01-14 DIAGNOSIS — J3089 Other allergic rhinitis: Secondary | ICD-10-CM | POA: Diagnosis not present

## 2019-01-15 DIAGNOSIS — M189 Osteoarthritis of first carpometacarpal joint, unspecified: Secondary | ICD-10-CM | POA: Diagnosis not present

## 2019-01-15 DIAGNOSIS — E039 Hypothyroidism, unspecified: Secondary | ICD-10-CM | POA: Diagnosis not present

## 2019-01-15 DIAGNOSIS — E782 Mixed hyperlipidemia: Secondary | ICD-10-CM | POA: Diagnosis not present

## 2019-01-15 DIAGNOSIS — M8580 Other specified disorders of bone density and structure, unspecified site: Secondary | ICD-10-CM | POA: Diagnosis not present

## 2019-01-15 DIAGNOSIS — Z85038 Personal history of other malignant neoplasm of large intestine: Secondary | ICD-10-CM | POA: Diagnosis not present

## 2019-01-15 DIAGNOSIS — I129 Hypertensive chronic kidney disease with stage 1 through stage 4 chronic kidney disease, or unspecified chronic kidney disease: Secondary | ICD-10-CM | POA: Diagnosis not present

## 2019-01-15 DIAGNOSIS — N183 Chronic kidney disease, stage 3 (moderate): Secondary | ICD-10-CM | POA: Diagnosis not present

## 2019-01-15 DIAGNOSIS — E7800 Pure hypercholesterolemia, unspecified: Secondary | ICD-10-CM | POA: Diagnosis not present

## 2019-01-15 DIAGNOSIS — D509 Iron deficiency anemia, unspecified: Secondary | ICD-10-CM | POA: Diagnosis not present

## 2019-02-04 DIAGNOSIS — T63451D Toxic effect of venom of hornets, accidental (unintentional), subsequent encounter: Secondary | ICD-10-CM | POA: Diagnosis not present

## 2019-02-11 DIAGNOSIS — M189 Osteoarthritis of first carpometacarpal joint, unspecified: Secondary | ICD-10-CM | POA: Diagnosis not present

## 2019-02-11 DIAGNOSIS — Z85038 Personal history of other malignant neoplasm of large intestine: Secondary | ICD-10-CM | POA: Diagnosis not present

## 2019-02-11 DIAGNOSIS — M8580 Other specified disorders of bone density and structure, unspecified site: Secondary | ICD-10-CM | POA: Diagnosis not present

## 2019-02-11 DIAGNOSIS — D509 Iron deficiency anemia, unspecified: Secondary | ICD-10-CM | POA: Diagnosis not present

## 2019-02-11 DIAGNOSIS — I129 Hypertensive chronic kidney disease with stage 1 through stage 4 chronic kidney disease, or unspecified chronic kidney disease: Secondary | ICD-10-CM | POA: Diagnosis not present

## 2019-02-11 DIAGNOSIS — E039 Hypothyroidism, unspecified: Secondary | ICD-10-CM | POA: Diagnosis not present

## 2019-02-11 DIAGNOSIS — N183 Chronic kidney disease, stage 3 (moderate): Secondary | ICD-10-CM | POA: Diagnosis not present

## 2019-02-11 DIAGNOSIS — E782 Mixed hyperlipidemia: Secondary | ICD-10-CM | POA: Diagnosis not present

## 2019-02-14 DIAGNOSIS — J3089 Other allergic rhinitis: Secondary | ICD-10-CM | POA: Diagnosis not present

## 2019-02-20 DIAGNOSIS — R3915 Urgency of urination: Secondary | ICD-10-CM | POA: Diagnosis not present

## 2019-02-27 DIAGNOSIS — R3915 Urgency of urination: Secondary | ICD-10-CM | POA: Diagnosis not present

## 2019-03-06 DIAGNOSIS — R1031 Right lower quadrant pain: Secondary | ICD-10-CM | POA: Diagnosis not present

## 2019-03-06 DIAGNOSIS — N393 Stress incontinence (female) (male): Secondary | ICD-10-CM | POA: Diagnosis not present

## 2019-03-06 DIAGNOSIS — R351 Nocturia: Secondary | ICD-10-CM | POA: Diagnosis not present

## 2019-03-06 DIAGNOSIS — R350 Frequency of micturition: Secondary | ICD-10-CM | POA: Diagnosis not present

## 2019-03-08 DIAGNOSIS — D509 Iron deficiency anemia, unspecified: Secondary | ICD-10-CM | POA: Diagnosis not present

## 2019-03-08 DIAGNOSIS — M189 Osteoarthritis of first carpometacarpal joint, unspecified: Secondary | ICD-10-CM | POA: Diagnosis not present

## 2019-03-08 DIAGNOSIS — E039 Hypothyroidism, unspecified: Secondary | ICD-10-CM | POA: Diagnosis not present

## 2019-03-08 DIAGNOSIS — N183 Chronic kidney disease, stage 3 (moderate): Secondary | ICD-10-CM | POA: Diagnosis not present

## 2019-03-08 DIAGNOSIS — I129 Hypertensive chronic kidney disease with stage 1 through stage 4 chronic kidney disease, or unspecified chronic kidney disease: Secondary | ICD-10-CM | POA: Diagnosis not present

## 2019-03-08 DIAGNOSIS — T63451D Toxic effect of venom of hornets, accidental (unintentional), subsequent encounter: Secondary | ICD-10-CM | POA: Diagnosis not present

## 2019-03-08 DIAGNOSIS — Z85038 Personal history of other malignant neoplasm of large intestine: Secondary | ICD-10-CM | POA: Diagnosis not present

## 2019-03-08 DIAGNOSIS — E782 Mixed hyperlipidemia: Secondary | ICD-10-CM | POA: Diagnosis not present

## 2019-03-08 DIAGNOSIS — E7800 Pure hypercholesterolemia, unspecified: Secondary | ICD-10-CM | POA: Diagnosis not present

## 2019-03-08 DIAGNOSIS — M8580 Other specified disorders of bone density and structure, unspecified site: Secondary | ICD-10-CM | POA: Diagnosis not present

## 2019-03-11 DIAGNOSIS — T63441D Toxic effect of venom of bees, accidental (unintentional), subsequent encounter: Secondary | ICD-10-CM | POA: Diagnosis not present

## 2019-03-11 DIAGNOSIS — Z91030 Bee allergy status: Secondary | ICD-10-CM | POA: Diagnosis not present

## 2019-03-11 DIAGNOSIS — J3089 Other allergic rhinitis: Secondary | ICD-10-CM | POA: Diagnosis not present

## 2019-03-11 DIAGNOSIS — H1045 Other chronic allergic conjunctivitis: Secondary | ICD-10-CM | POA: Diagnosis not present

## 2019-03-19 DIAGNOSIS — J3089 Other allergic rhinitis: Secondary | ICD-10-CM | POA: Diagnosis not present

## 2019-04-01 DIAGNOSIS — J3089 Other allergic rhinitis: Secondary | ICD-10-CM | POA: Diagnosis not present

## 2019-04-08 DIAGNOSIS — T63461D Toxic effect of venom of wasps, accidental (unintentional), subsequent encounter: Secondary | ICD-10-CM | POA: Diagnosis not present

## 2019-04-12 DIAGNOSIS — Z23 Encounter for immunization: Secondary | ICD-10-CM | POA: Diagnosis not present

## 2019-04-17 DIAGNOSIS — M8580 Other specified disorders of bone density and structure, unspecified site: Secondary | ICD-10-CM | POA: Diagnosis not present

## 2019-04-17 DIAGNOSIS — N183 Chronic kidney disease, stage 3 (moderate): Secondary | ICD-10-CM | POA: Diagnosis not present

## 2019-04-17 DIAGNOSIS — M189 Osteoarthritis of first carpometacarpal joint, unspecified: Secondary | ICD-10-CM | POA: Diagnosis not present

## 2019-04-17 DIAGNOSIS — Z85038 Personal history of other malignant neoplasm of large intestine: Secondary | ICD-10-CM | POA: Diagnosis not present

## 2019-04-17 DIAGNOSIS — E039 Hypothyroidism, unspecified: Secondary | ICD-10-CM | POA: Diagnosis not present

## 2019-04-17 DIAGNOSIS — E782 Mixed hyperlipidemia: Secondary | ICD-10-CM | POA: Diagnosis not present

## 2019-04-17 DIAGNOSIS — D509 Iron deficiency anemia, unspecified: Secondary | ICD-10-CM | POA: Diagnosis not present

## 2019-04-17 DIAGNOSIS — E7800 Pure hypercholesterolemia, unspecified: Secondary | ICD-10-CM | POA: Diagnosis not present

## 2019-04-17 DIAGNOSIS — I129 Hypertensive chronic kidney disease with stage 1 through stage 4 chronic kidney disease, or unspecified chronic kidney disease: Secondary | ICD-10-CM | POA: Diagnosis not present

## 2019-04-23 DIAGNOSIS — J3089 Other allergic rhinitis: Secondary | ICD-10-CM | POA: Diagnosis not present

## 2019-04-26 DIAGNOSIS — J3089 Other allergic rhinitis: Secondary | ICD-10-CM | POA: Diagnosis not present

## 2019-04-29 DIAGNOSIS — J3089 Other allergic rhinitis: Secondary | ICD-10-CM | POA: Diagnosis not present

## 2019-05-01 DIAGNOSIS — J3089 Other allergic rhinitis: Secondary | ICD-10-CM | POA: Diagnosis not present

## 2019-05-06 DIAGNOSIS — J3089 Other allergic rhinitis: Secondary | ICD-10-CM | POA: Diagnosis not present

## 2019-05-08 DIAGNOSIS — R351 Nocturia: Secondary | ICD-10-CM | POA: Diagnosis not present

## 2019-05-08 DIAGNOSIS — R1031 Right lower quadrant pain: Secondary | ICD-10-CM | POA: Diagnosis not present

## 2019-05-10 DIAGNOSIS — I129 Hypertensive chronic kidney disease with stage 1 through stage 4 chronic kidney disease, or unspecified chronic kidney disease: Secondary | ICD-10-CM | POA: Diagnosis not present

## 2019-05-10 DIAGNOSIS — M189 Osteoarthritis of first carpometacarpal joint, unspecified: Secondary | ICD-10-CM | POA: Diagnosis not present

## 2019-05-10 DIAGNOSIS — Z85038 Personal history of other malignant neoplasm of large intestine: Secondary | ICD-10-CM | POA: Diagnosis not present

## 2019-05-10 DIAGNOSIS — D509 Iron deficiency anemia, unspecified: Secondary | ICD-10-CM | POA: Diagnosis not present

## 2019-05-10 DIAGNOSIS — N183 Chronic kidney disease, stage 3 unspecified: Secondary | ICD-10-CM | POA: Diagnosis not present

## 2019-05-10 DIAGNOSIS — E039 Hypothyroidism, unspecified: Secondary | ICD-10-CM | POA: Diagnosis not present

## 2019-05-10 DIAGNOSIS — E7800 Pure hypercholesterolemia, unspecified: Secondary | ICD-10-CM | POA: Diagnosis not present

## 2019-05-10 DIAGNOSIS — E782 Mixed hyperlipidemia: Secondary | ICD-10-CM | POA: Diagnosis not present

## 2019-05-10 DIAGNOSIS — M8580 Other specified disorders of bone density and structure, unspecified site: Secondary | ICD-10-CM | POA: Diagnosis not present

## 2019-05-13 DIAGNOSIS — Z803 Family history of malignant neoplasm of breast: Secondary | ICD-10-CM | POA: Diagnosis not present

## 2019-05-13 DIAGNOSIS — Z1231 Encounter for screening mammogram for malignant neoplasm of breast: Secondary | ICD-10-CM | POA: Diagnosis not present

## 2019-05-21 DIAGNOSIS — T63451D Toxic effect of venom of hornets, accidental (unintentional), subsequent encounter: Secondary | ICD-10-CM | POA: Diagnosis not present

## 2019-06-05 DIAGNOSIS — J3089 Other allergic rhinitis: Secondary | ICD-10-CM | POA: Diagnosis not present

## 2019-07-25 DIAGNOSIS — T63451D Toxic effect of venom of hornets, accidental (unintentional), subsequent encounter: Secondary | ICD-10-CM | POA: Diagnosis not present

## 2019-08-13 DIAGNOSIS — I129 Hypertensive chronic kidney disease with stage 1 through stage 4 chronic kidney disease, or unspecified chronic kidney disease: Secondary | ICD-10-CM | POA: Diagnosis not present

## 2019-08-13 DIAGNOSIS — E039 Hypothyroidism, unspecified: Secondary | ICD-10-CM | POA: Diagnosis not present

## 2019-08-13 DIAGNOSIS — M8580 Other specified disorders of bone density and structure, unspecified site: Secondary | ICD-10-CM | POA: Diagnosis not present

## 2019-08-13 DIAGNOSIS — N183 Chronic kidney disease, stage 3 unspecified: Secondary | ICD-10-CM | POA: Diagnosis not present

## 2019-08-13 DIAGNOSIS — D509 Iron deficiency anemia, unspecified: Secondary | ICD-10-CM | POA: Diagnosis not present

## 2019-08-13 DIAGNOSIS — Z85038 Personal history of other malignant neoplasm of large intestine: Secondary | ICD-10-CM | POA: Diagnosis not present

## 2019-08-13 DIAGNOSIS — M189 Osteoarthritis of first carpometacarpal joint, unspecified: Secondary | ICD-10-CM | POA: Diagnosis not present

## 2019-08-13 DIAGNOSIS — E782 Mixed hyperlipidemia: Secondary | ICD-10-CM | POA: Diagnosis not present

## 2019-08-13 DIAGNOSIS — E7800 Pure hypercholesterolemia, unspecified: Secondary | ICD-10-CM | POA: Diagnosis not present

## 2019-09-02 DIAGNOSIS — T63451D Toxic effect of venom of hornets, accidental (unintentional), subsequent encounter: Secondary | ICD-10-CM | POA: Diagnosis not present

## 2019-09-10 DIAGNOSIS — M8580 Other specified disorders of bone density and structure, unspecified site: Secondary | ICD-10-CM | POA: Diagnosis not present

## 2019-09-10 DIAGNOSIS — E7800 Pure hypercholesterolemia, unspecified: Secondary | ICD-10-CM | POA: Diagnosis not present

## 2019-09-10 DIAGNOSIS — E782 Mixed hyperlipidemia: Secondary | ICD-10-CM | POA: Diagnosis not present

## 2019-09-10 DIAGNOSIS — N183 Chronic kidney disease, stage 3 unspecified: Secondary | ICD-10-CM | POA: Diagnosis not present

## 2019-09-10 DIAGNOSIS — E039 Hypothyroidism, unspecified: Secondary | ICD-10-CM | POA: Diagnosis not present

## 2019-09-10 DIAGNOSIS — D509 Iron deficiency anemia, unspecified: Secondary | ICD-10-CM | POA: Diagnosis not present

## 2019-09-10 DIAGNOSIS — M189 Osteoarthritis of first carpometacarpal joint, unspecified: Secondary | ICD-10-CM | POA: Diagnosis not present

## 2019-09-10 DIAGNOSIS — Z85038 Personal history of other malignant neoplasm of large intestine: Secondary | ICD-10-CM | POA: Diagnosis not present

## 2019-09-10 DIAGNOSIS — I129 Hypertensive chronic kidney disease with stage 1 through stage 4 chronic kidney disease, or unspecified chronic kidney disease: Secondary | ICD-10-CM | POA: Diagnosis not present

## 2019-09-12 DIAGNOSIS — J3089 Other allergic rhinitis: Secondary | ICD-10-CM | POA: Diagnosis not present

## 2019-09-16 DIAGNOSIS — J3089 Other allergic rhinitis: Secondary | ICD-10-CM | POA: Diagnosis not present

## 2019-10-02 DIAGNOSIS — T63451D Toxic effect of venom of hornets, accidental (unintentional), subsequent encounter: Secondary | ICD-10-CM | POA: Diagnosis not present

## 2019-10-16 DIAGNOSIS — J3089 Other allergic rhinitis: Secondary | ICD-10-CM | POA: Diagnosis not present

## 2019-10-22 DIAGNOSIS — E7800 Pure hypercholesterolemia, unspecified: Secondary | ICD-10-CM | POA: Diagnosis not present

## 2019-10-22 DIAGNOSIS — Z0000 Encounter for general adult medical examination without abnormal findings: Secondary | ICD-10-CM | POA: Diagnosis not present

## 2019-10-22 DIAGNOSIS — N1831 Chronic kidney disease, stage 3a: Secondary | ICD-10-CM | POA: Diagnosis not present

## 2019-10-22 DIAGNOSIS — K219 Gastro-esophageal reflux disease without esophagitis: Secondary | ICD-10-CM | POA: Diagnosis not present

## 2019-10-22 DIAGNOSIS — Z6829 Body mass index (BMI) 29.0-29.9, adult: Secondary | ICD-10-CM | POA: Diagnosis not present

## 2019-10-22 DIAGNOSIS — I129 Hypertensive chronic kidney disease with stage 1 through stage 4 chronic kidney disease, or unspecified chronic kidney disease: Secondary | ICD-10-CM | POA: Diagnosis not present

## 2019-10-22 DIAGNOSIS — M8580 Other specified disorders of bone density and structure, unspecified site: Secondary | ICD-10-CM | POA: Diagnosis not present

## 2019-10-22 DIAGNOSIS — Z1389 Encounter for screening for other disorder: Secondary | ICD-10-CM | POA: Diagnosis not present

## 2019-10-22 DIAGNOSIS — E039 Hypothyroidism, unspecified: Secondary | ICD-10-CM | POA: Diagnosis not present

## 2019-10-22 DIAGNOSIS — I700 Atherosclerosis of aorta: Secondary | ICD-10-CM | POA: Diagnosis not present

## 2019-10-22 DIAGNOSIS — D509 Iron deficiency anemia, unspecified: Secondary | ICD-10-CM | POA: Diagnosis not present

## 2019-11-01 DIAGNOSIS — T63451D Toxic effect of venom of hornets, accidental (unintentional), subsequent encounter: Secondary | ICD-10-CM | POA: Diagnosis not present

## 2019-11-07 DIAGNOSIS — Z85038 Personal history of other malignant neoplasm of large intestine: Secondary | ICD-10-CM | POA: Diagnosis not present

## 2019-11-07 DIAGNOSIS — K219 Gastro-esophageal reflux disease without esophagitis: Secondary | ICD-10-CM | POA: Diagnosis not present

## 2019-11-07 DIAGNOSIS — K449 Diaphragmatic hernia without obstruction or gangrene: Secondary | ICD-10-CM | POA: Diagnosis not present

## 2019-11-15 DIAGNOSIS — J3089 Other allergic rhinitis: Secondary | ICD-10-CM | POA: Diagnosis not present

## 2019-11-20 DIAGNOSIS — R739 Hyperglycemia, unspecified: Secondary | ICD-10-CM | POA: Diagnosis not present

## 2019-12-02 DIAGNOSIS — T63451D Toxic effect of venom of hornets, accidental (unintentional), subsequent encounter: Secondary | ICD-10-CM | POA: Diagnosis not present

## 2019-12-16 DIAGNOSIS — Z85038 Personal history of other malignant neoplasm of large intestine: Secondary | ICD-10-CM | POA: Diagnosis not present

## 2019-12-16 DIAGNOSIS — M189 Osteoarthritis of first carpometacarpal joint, unspecified: Secondary | ICD-10-CM | POA: Diagnosis not present

## 2019-12-16 DIAGNOSIS — N1831 Chronic kidney disease, stage 3a: Secondary | ICD-10-CM | POA: Diagnosis not present

## 2019-12-16 DIAGNOSIS — E7800 Pure hypercholesterolemia, unspecified: Secondary | ICD-10-CM | POA: Diagnosis not present

## 2019-12-16 DIAGNOSIS — D509 Iron deficiency anemia, unspecified: Secondary | ICD-10-CM | POA: Diagnosis not present

## 2019-12-16 DIAGNOSIS — E039 Hypothyroidism, unspecified: Secondary | ICD-10-CM | POA: Diagnosis not present

## 2019-12-16 DIAGNOSIS — I129 Hypertensive chronic kidney disease with stage 1 through stage 4 chronic kidney disease, or unspecified chronic kidney disease: Secondary | ICD-10-CM | POA: Diagnosis not present

## 2019-12-16 DIAGNOSIS — E782 Mixed hyperlipidemia: Secondary | ICD-10-CM | POA: Diagnosis not present

## 2019-12-16 DIAGNOSIS — M8580 Other specified disorders of bone density and structure, unspecified site: Secondary | ICD-10-CM | POA: Diagnosis not present

## 2019-12-17 DIAGNOSIS — Z03818 Encounter for observation for suspected exposure to other biological agents ruled out: Secondary | ICD-10-CM | POA: Diagnosis not present

## 2019-12-18 DIAGNOSIS — J3089 Other allergic rhinitis: Secondary | ICD-10-CM | POA: Diagnosis not present

## 2019-12-20 DIAGNOSIS — K5730 Diverticulosis of large intestine without perforation or abscess without bleeding: Secondary | ICD-10-CM | POA: Diagnosis not present

## 2019-12-20 DIAGNOSIS — Z85038 Personal history of other malignant neoplasm of large intestine: Secondary | ICD-10-CM | POA: Diagnosis not present

## 2019-12-20 DIAGNOSIS — K219 Gastro-esophageal reflux disease without esophagitis: Secondary | ICD-10-CM | POA: Diagnosis not present

## 2019-12-20 DIAGNOSIS — K449 Diaphragmatic hernia without obstruction or gangrene: Secondary | ICD-10-CM | POA: Diagnosis not present

## 2019-12-20 DIAGNOSIS — K5289 Other specified noninfective gastroenteritis and colitis: Secondary | ICD-10-CM | POA: Diagnosis not present

## 2019-12-20 DIAGNOSIS — K633 Ulcer of intestine: Secondary | ICD-10-CM | POA: Diagnosis not present

## 2019-12-20 DIAGNOSIS — K648 Other hemorrhoids: Secondary | ICD-10-CM | POA: Diagnosis not present

## 2019-12-25 DIAGNOSIS — K5289 Other specified noninfective gastroenteritis and colitis: Secondary | ICD-10-CM | POA: Diagnosis not present

## 2020-01-31 DIAGNOSIS — T63441D Toxic effect of venom of bees, accidental (unintentional), subsequent encounter: Secondary | ICD-10-CM | POA: Diagnosis not present

## 2020-02-10 DIAGNOSIS — Z85038 Personal history of other malignant neoplasm of large intestine: Secondary | ICD-10-CM | POA: Diagnosis not present

## 2020-02-10 DIAGNOSIS — I129 Hypertensive chronic kidney disease with stage 1 through stage 4 chronic kidney disease, or unspecified chronic kidney disease: Secondary | ICD-10-CM | POA: Diagnosis not present

## 2020-02-10 DIAGNOSIS — M8580 Other specified disorders of bone density and structure, unspecified site: Secondary | ICD-10-CM | POA: Diagnosis not present

## 2020-02-10 DIAGNOSIS — N1831 Chronic kidney disease, stage 3a: Secondary | ICD-10-CM | POA: Diagnosis not present

## 2020-02-10 DIAGNOSIS — D509 Iron deficiency anemia, unspecified: Secondary | ICD-10-CM | POA: Diagnosis not present

## 2020-02-10 DIAGNOSIS — M189 Osteoarthritis of first carpometacarpal joint, unspecified: Secondary | ICD-10-CM | POA: Diagnosis not present

## 2020-02-10 DIAGNOSIS — E782 Mixed hyperlipidemia: Secondary | ICD-10-CM | POA: Diagnosis not present

## 2020-02-10 DIAGNOSIS — E7800 Pure hypercholesterolemia, unspecified: Secondary | ICD-10-CM | POA: Diagnosis not present

## 2020-02-10 DIAGNOSIS — E039 Hypothyroidism, unspecified: Secondary | ICD-10-CM | POA: Diagnosis not present

## 2020-02-19 DIAGNOSIS — N1831 Chronic kidney disease, stage 3a: Secondary | ICD-10-CM | POA: Diagnosis not present

## 2020-02-19 DIAGNOSIS — M189 Osteoarthritis of first carpometacarpal joint, unspecified: Secondary | ICD-10-CM | POA: Diagnosis not present

## 2020-02-19 DIAGNOSIS — K219 Gastro-esophageal reflux disease without esophagitis: Secondary | ICD-10-CM | POA: Diagnosis not present

## 2020-02-19 DIAGNOSIS — M8580 Other specified disorders of bone density and structure, unspecified site: Secondary | ICD-10-CM | POA: Diagnosis not present

## 2020-02-19 DIAGNOSIS — E7800 Pure hypercholesterolemia, unspecified: Secondary | ICD-10-CM | POA: Diagnosis not present

## 2020-02-19 DIAGNOSIS — E782 Mixed hyperlipidemia: Secondary | ICD-10-CM | POA: Diagnosis not present

## 2020-02-19 DIAGNOSIS — Z85038 Personal history of other malignant neoplasm of large intestine: Secondary | ICD-10-CM | POA: Diagnosis not present

## 2020-02-19 DIAGNOSIS — D509 Iron deficiency anemia, unspecified: Secondary | ICD-10-CM | POA: Diagnosis not present

## 2020-02-19 DIAGNOSIS — I129 Hypertensive chronic kidney disease with stage 1 through stage 4 chronic kidney disease, or unspecified chronic kidney disease: Secondary | ICD-10-CM | POA: Diagnosis not present

## 2020-02-19 DIAGNOSIS — E039 Hypothyroidism, unspecified: Secondary | ICD-10-CM | POA: Diagnosis not present

## 2020-02-21 DIAGNOSIS — J3089 Other allergic rhinitis: Secondary | ICD-10-CM | POA: Diagnosis not present

## 2020-03-03 DIAGNOSIS — T63451D Toxic effect of venom of hornets, accidental (unintentional), subsequent encounter: Secondary | ICD-10-CM | POA: Diagnosis not present

## 2020-03-10 DIAGNOSIS — J019 Acute sinusitis, unspecified: Secondary | ICD-10-CM | POA: Diagnosis not present

## 2020-03-10 DIAGNOSIS — J3089 Other allergic rhinitis: Secondary | ICD-10-CM | POA: Diagnosis not present

## 2020-03-10 DIAGNOSIS — T63441D Toxic effect of venom of bees, accidental (unintentional), subsequent encounter: Secondary | ICD-10-CM | POA: Diagnosis not present

## 2020-03-10 DIAGNOSIS — H1045 Other chronic allergic conjunctivitis: Secondary | ICD-10-CM | POA: Diagnosis not present

## 2020-03-27 DIAGNOSIS — J3089 Other allergic rhinitis: Secondary | ICD-10-CM | POA: Diagnosis not present

## 2020-04-04 DIAGNOSIS — Z20822 Contact with and (suspected) exposure to covid-19: Secondary | ICD-10-CM | POA: Diagnosis not present

## 2020-04-06 DIAGNOSIS — T63451D Toxic effect of venom of hornets, accidental (unintentional), subsequent encounter: Secondary | ICD-10-CM | POA: Diagnosis not present

## 2020-04-14 DIAGNOSIS — I129 Hypertensive chronic kidney disease with stage 1 through stage 4 chronic kidney disease, or unspecified chronic kidney disease: Secondary | ICD-10-CM | POA: Diagnosis not present

## 2020-04-14 DIAGNOSIS — K219 Gastro-esophageal reflux disease without esophagitis: Secondary | ICD-10-CM | POA: Diagnosis not present

## 2020-04-14 DIAGNOSIS — M8580 Other specified disorders of bone density and structure, unspecified site: Secondary | ICD-10-CM | POA: Diagnosis not present

## 2020-04-14 DIAGNOSIS — Z23 Encounter for immunization: Secondary | ICD-10-CM | POA: Diagnosis not present

## 2020-04-14 DIAGNOSIS — E7800 Pure hypercholesterolemia, unspecified: Secondary | ICD-10-CM | POA: Diagnosis not present

## 2020-04-14 DIAGNOSIS — N1831 Chronic kidney disease, stage 3a: Secondary | ICD-10-CM | POA: Diagnosis not present

## 2020-04-14 DIAGNOSIS — E782 Mixed hyperlipidemia: Secondary | ICD-10-CM | POA: Diagnosis not present

## 2020-04-14 DIAGNOSIS — M189 Osteoarthritis of first carpometacarpal joint, unspecified: Secondary | ICD-10-CM | POA: Diagnosis not present

## 2020-04-14 DIAGNOSIS — D509 Iron deficiency anemia, unspecified: Secondary | ICD-10-CM | POA: Diagnosis not present

## 2020-04-14 DIAGNOSIS — Z85038 Personal history of other malignant neoplasm of large intestine: Secondary | ICD-10-CM | POA: Diagnosis not present

## 2020-04-14 DIAGNOSIS — E039 Hypothyroidism, unspecified: Secondary | ICD-10-CM | POA: Diagnosis not present

## 2020-05-04 DIAGNOSIS — J3089 Other allergic rhinitis: Secondary | ICD-10-CM | POA: Diagnosis not present

## 2020-05-08 DIAGNOSIS — J3089 Other allergic rhinitis: Secondary | ICD-10-CM | POA: Diagnosis not present

## 2020-05-08 DIAGNOSIS — J301 Allergic rhinitis due to pollen: Secondary | ICD-10-CM | POA: Diagnosis not present

## 2020-05-11 DIAGNOSIS — J301 Allergic rhinitis due to pollen: Secondary | ICD-10-CM | POA: Diagnosis not present

## 2020-05-11 DIAGNOSIS — J3089 Other allergic rhinitis: Secondary | ICD-10-CM | POA: Diagnosis not present

## 2020-05-12 DIAGNOSIS — N1831 Chronic kidney disease, stage 3a: Secondary | ICD-10-CM | POA: Diagnosis not present

## 2020-05-12 DIAGNOSIS — M8580 Other specified disorders of bone density and structure, unspecified site: Secondary | ICD-10-CM | POA: Diagnosis not present

## 2020-05-12 DIAGNOSIS — Z23 Encounter for immunization: Secondary | ICD-10-CM | POA: Diagnosis not present

## 2020-05-12 DIAGNOSIS — E782 Mixed hyperlipidemia: Secondary | ICD-10-CM | POA: Diagnosis not present

## 2020-05-12 DIAGNOSIS — I129 Hypertensive chronic kidney disease with stage 1 through stage 4 chronic kidney disease, or unspecified chronic kidney disease: Secondary | ICD-10-CM | POA: Diagnosis not present

## 2020-05-12 DIAGNOSIS — Z85038 Personal history of other malignant neoplasm of large intestine: Secondary | ICD-10-CM | POA: Diagnosis not present

## 2020-05-12 DIAGNOSIS — E039 Hypothyroidism, unspecified: Secondary | ICD-10-CM | POA: Diagnosis not present

## 2020-05-12 DIAGNOSIS — K219 Gastro-esophageal reflux disease without esophagitis: Secondary | ICD-10-CM | POA: Diagnosis not present

## 2020-05-12 DIAGNOSIS — E7800 Pure hypercholesterolemia, unspecified: Secondary | ICD-10-CM | POA: Diagnosis not present

## 2020-05-12 DIAGNOSIS — D509 Iron deficiency anemia, unspecified: Secondary | ICD-10-CM | POA: Diagnosis not present

## 2020-05-12 DIAGNOSIS — I700 Atherosclerosis of aorta: Secondary | ICD-10-CM | POA: Diagnosis not present

## 2020-05-12 DIAGNOSIS — M189 Osteoarthritis of first carpometacarpal joint, unspecified: Secondary | ICD-10-CM | POA: Diagnosis not present

## 2020-05-15 DIAGNOSIS — J301 Allergic rhinitis due to pollen: Secondary | ICD-10-CM | POA: Diagnosis not present

## 2020-05-15 DIAGNOSIS — J3089 Other allergic rhinitis: Secondary | ICD-10-CM | POA: Diagnosis not present

## 2020-05-18 DIAGNOSIS — J3081 Allergic rhinitis due to animal (cat) (dog) hair and dander: Secondary | ICD-10-CM | POA: Diagnosis not present

## 2020-05-18 DIAGNOSIS — J3089 Other allergic rhinitis: Secondary | ICD-10-CM | POA: Diagnosis not present

## 2020-05-26 DIAGNOSIS — Z780 Asymptomatic menopausal state: Secondary | ICD-10-CM | POA: Diagnosis not present

## 2020-05-26 DIAGNOSIS — M85852 Other specified disorders of bone density and structure, left thigh: Secondary | ICD-10-CM | POA: Diagnosis not present

## 2020-05-26 DIAGNOSIS — M85851 Other specified disorders of bone density and structure, right thigh: Secondary | ICD-10-CM | POA: Diagnosis not present

## 2020-06-15 DIAGNOSIS — T63451D Toxic effect of venom of hornets, accidental (unintentional), subsequent encounter: Secondary | ICD-10-CM | POA: Diagnosis not present

## 2020-06-15 DIAGNOSIS — E782 Mixed hyperlipidemia: Secondary | ICD-10-CM | POA: Diagnosis not present

## 2020-06-15 DIAGNOSIS — I129 Hypertensive chronic kidney disease with stage 1 through stage 4 chronic kidney disease, or unspecified chronic kidney disease: Secondary | ICD-10-CM | POA: Diagnosis not present

## 2020-06-15 DIAGNOSIS — D509 Iron deficiency anemia, unspecified: Secondary | ICD-10-CM | POA: Diagnosis not present

## 2020-06-15 DIAGNOSIS — M8580 Other specified disorders of bone density and structure, unspecified site: Secondary | ICD-10-CM | POA: Diagnosis not present

## 2020-06-15 DIAGNOSIS — J3089 Other allergic rhinitis: Secondary | ICD-10-CM | POA: Diagnosis not present

## 2020-06-15 DIAGNOSIS — Z85038 Personal history of other malignant neoplasm of large intestine: Secondary | ICD-10-CM | POA: Diagnosis not present

## 2020-06-15 DIAGNOSIS — M189 Osteoarthritis of first carpometacarpal joint, unspecified: Secondary | ICD-10-CM | POA: Diagnosis not present

## 2020-06-15 DIAGNOSIS — N1831 Chronic kidney disease, stage 3a: Secondary | ICD-10-CM | POA: Diagnosis not present

## 2020-06-15 DIAGNOSIS — E7800 Pure hypercholesterolemia, unspecified: Secondary | ICD-10-CM | POA: Diagnosis not present

## 2020-06-15 DIAGNOSIS — K219 Gastro-esophageal reflux disease without esophagitis: Secondary | ICD-10-CM | POA: Diagnosis not present

## 2020-06-15 DIAGNOSIS — E039 Hypothyroidism, unspecified: Secondary | ICD-10-CM | POA: Diagnosis not present

## 2020-06-30 DIAGNOSIS — Z961 Presence of intraocular lens: Secondary | ICD-10-CM | POA: Diagnosis not present

## 2020-06-30 DIAGNOSIS — H52203 Unspecified astigmatism, bilateral: Secondary | ICD-10-CM | POA: Diagnosis not present

## 2020-06-30 DIAGNOSIS — H26492 Other secondary cataract, left eye: Secondary | ICD-10-CM | POA: Diagnosis not present

## 2020-06-30 DIAGNOSIS — H43813 Vitreous degeneration, bilateral: Secondary | ICD-10-CM | POA: Diagnosis not present

## 2020-07-08 DIAGNOSIS — D509 Iron deficiency anemia, unspecified: Secondary | ICD-10-CM | POA: Diagnosis not present

## 2020-07-08 DIAGNOSIS — M189 Osteoarthritis of first carpometacarpal joint, unspecified: Secondary | ICD-10-CM | POA: Diagnosis not present

## 2020-07-08 DIAGNOSIS — K219 Gastro-esophageal reflux disease without esophagitis: Secondary | ICD-10-CM | POA: Diagnosis not present

## 2020-07-08 DIAGNOSIS — E7800 Pure hypercholesterolemia, unspecified: Secondary | ICD-10-CM | POA: Diagnosis not present

## 2020-07-08 DIAGNOSIS — N1831 Chronic kidney disease, stage 3a: Secondary | ICD-10-CM | POA: Diagnosis not present

## 2020-07-08 DIAGNOSIS — E782 Mixed hyperlipidemia: Secondary | ICD-10-CM | POA: Diagnosis not present

## 2020-07-08 DIAGNOSIS — E039 Hypothyroidism, unspecified: Secondary | ICD-10-CM | POA: Diagnosis not present

## 2020-07-08 DIAGNOSIS — Z85038 Personal history of other malignant neoplasm of large intestine: Secondary | ICD-10-CM | POA: Diagnosis not present

## 2020-07-08 DIAGNOSIS — I129 Hypertensive chronic kidney disease with stage 1 through stage 4 chronic kidney disease, or unspecified chronic kidney disease: Secondary | ICD-10-CM | POA: Diagnosis not present

## 2020-07-08 DIAGNOSIS — M8580 Other specified disorders of bone density and structure, unspecified site: Secondary | ICD-10-CM | POA: Diagnosis not present

## 2020-07-14 DIAGNOSIS — J3089 Other allergic rhinitis: Secondary | ICD-10-CM | POA: Diagnosis not present

## 2020-08-11 DIAGNOSIS — M8580 Other specified disorders of bone density and structure, unspecified site: Secondary | ICD-10-CM | POA: Diagnosis not present

## 2020-08-11 DIAGNOSIS — E039 Hypothyroidism, unspecified: Secondary | ICD-10-CM | POA: Diagnosis not present

## 2020-08-11 DIAGNOSIS — M189 Osteoarthritis of first carpometacarpal joint, unspecified: Secondary | ICD-10-CM | POA: Diagnosis not present

## 2020-08-11 DIAGNOSIS — E782 Mixed hyperlipidemia: Secondary | ICD-10-CM | POA: Diagnosis not present

## 2020-08-11 DIAGNOSIS — Z85038 Personal history of other malignant neoplasm of large intestine: Secondary | ICD-10-CM | POA: Diagnosis not present

## 2020-08-11 DIAGNOSIS — E7800 Pure hypercholesterolemia, unspecified: Secondary | ICD-10-CM | POA: Diagnosis not present

## 2020-08-11 DIAGNOSIS — D509 Iron deficiency anemia, unspecified: Secondary | ICD-10-CM | POA: Diagnosis not present

## 2020-08-11 DIAGNOSIS — K219 Gastro-esophageal reflux disease without esophagitis: Secondary | ICD-10-CM | POA: Diagnosis not present

## 2020-08-11 DIAGNOSIS — N1831 Chronic kidney disease, stage 3a: Secondary | ICD-10-CM | POA: Diagnosis not present

## 2020-08-14 DIAGNOSIS — J301 Allergic rhinitis due to pollen: Secondary | ICD-10-CM | POA: Diagnosis not present

## 2020-08-14 DIAGNOSIS — T63451D Toxic effect of venom of hornets, accidental (unintentional), subsequent encounter: Secondary | ICD-10-CM | POA: Diagnosis not present

## 2020-09-09 DIAGNOSIS — M189 Osteoarthritis of first carpometacarpal joint, unspecified: Secondary | ICD-10-CM | POA: Diagnosis not present

## 2020-09-09 DIAGNOSIS — D509 Iron deficiency anemia, unspecified: Secondary | ICD-10-CM | POA: Diagnosis not present

## 2020-09-09 DIAGNOSIS — Z85038 Personal history of other malignant neoplasm of large intestine: Secondary | ICD-10-CM | POA: Diagnosis not present

## 2020-09-09 DIAGNOSIS — E039 Hypothyroidism, unspecified: Secondary | ICD-10-CM | POA: Diagnosis not present

## 2020-09-09 DIAGNOSIS — E7800 Pure hypercholesterolemia, unspecified: Secondary | ICD-10-CM | POA: Diagnosis not present

## 2020-09-09 DIAGNOSIS — N1831 Chronic kidney disease, stage 3a: Secondary | ICD-10-CM | POA: Diagnosis not present

## 2020-09-09 DIAGNOSIS — K219 Gastro-esophageal reflux disease without esophagitis: Secondary | ICD-10-CM | POA: Diagnosis not present

## 2020-09-09 DIAGNOSIS — E782 Mixed hyperlipidemia: Secondary | ICD-10-CM | POA: Diagnosis not present

## 2020-09-09 DIAGNOSIS — M8580 Other specified disorders of bone density and structure, unspecified site: Secondary | ICD-10-CM | POA: Diagnosis not present

## 2020-09-09 DIAGNOSIS — I129 Hypertensive chronic kidney disease with stage 1 through stage 4 chronic kidney disease, or unspecified chronic kidney disease: Secondary | ICD-10-CM | POA: Diagnosis not present

## 2020-09-14 DIAGNOSIS — J3081 Allergic rhinitis due to animal (cat) (dog) hair and dander: Secondary | ICD-10-CM | POA: Diagnosis not present

## 2020-09-14 DIAGNOSIS — T63451D Toxic effect of venom of hornets, accidental (unintentional), subsequent encounter: Secondary | ICD-10-CM | POA: Diagnosis not present

## 2020-09-14 DIAGNOSIS — J301 Allergic rhinitis due to pollen: Secondary | ICD-10-CM | POA: Diagnosis not present

## 2020-09-17 DIAGNOSIS — H26492 Other secondary cataract, left eye: Secondary | ICD-10-CM | POA: Diagnosis not present

## 2020-10-06 DIAGNOSIS — I129 Hypertensive chronic kidney disease with stage 1 through stage 4 chronic kidney disease, or unspecified chronic kidney disease: Secondary | ICD-10-CM | POA: Diagnosis not present

## 2020-10-06 DIAGNOSIS — E039 Hypothyroidism, unspecified: Secondary | ICD-10-CM | POA: Diagnosis not present

## 2020-10-06 DIAGNOSIS — M8580 Other specified disorders of bone density and structure, unspecified site: Secondary | ICD-10-CM | POA: Diagnosis not present

## 2020-10-06 DIAGNOSIS — K219 Gastro-esophageal reflux disease without esophagitis: Secondary | ICD-10-CM | POA: Diagnosis not present

## 2020-10-06 DIAGNOSIS — M189 Osteoarthritis of first carpometacarpal joint, unspecified: Secondary | ICD-10-CM | POA: Diagnosis not present

## 2020-10-06 DIAGNOSIS — N1831 Chronic kidney disease, stage 3a: Secondary | ICD-10-CM | POA: Diagnosis not present

## 2020-10-06 DIAGNOSIS — E7800 Pure hypercholesterolemia, unspecified: Secondary | ICD-10-CM | POA: Diagnosis not present

## 2020-10-06 DIAGNOSIS — E782 Mixed hyperlipidemia: Secondary | ICD-10-CM | POA: Diagnosis not present

## 2020-10-06 DIAGNOSIS — Z85038 Personal history of other malignant neoplasm of large intestine: Secondary | ICD-10-CM | POA: Diagnosis not present

## 2020-10-06 DIAGNOSIS — D509 Iron deficiency anemia, unspecified: Secondary | ICD-10-CM | POA: Diagnosis not present

## 2020-10-14 DIAGNOSIS — J301 Allergic rhinitis due to pollen: Secondary | ICD-10-CM | POA: Diagnosis not present

## 2020-10-14 DIAGNOSIS — J3089 Other allergic rhinitis: Secondary | ICD-10-CM | POA: Diagnosis not present

## 2020-10-14 DIAGNOSIS — T63451D Toxic effect of venom of hornets, accidental (unintentional), subsequent encounter: Secondary | ICD-10-CM | POA: Diagnosis not present

## 2020-11-03 DIAGNOSIS — N1831 Chronic kidney disease, stage 3a: Secondary | ICD-10-CM | POA: Diagnosis not present

## 2020-11-03 DIAGNOSIS — I700 Atherosclerosis of aorta: Secondary | ICD-10-CM | POA: Diagnosis not present

## 2020-11-03 DIAGNOSIS — I129 Hypertensive chronic kidney disease with stage 1 through stage 4 chronic kidney disease, or unspecified chronic kidney disease: Secondary | ICD-10-CM | POA: Diagnosis not present

## 2020-11-03 DIAGNOSIS — K219 Gastro-esophageal reflux disease without esophagitis: Secondary | ICD-10-CM | POA: Diagnosis not present

## 2020-11-03 DIAGNOSIS — E039 Hypothyroidism, unspecified: Secondary | ICD-10-CM | POA: Diagnosis not present

## 2020-11-03 DIAGNOSIS — E7800 Pure hypercholesterolemia, unspecified: Secondary | ICD-10-CM | POA: Diagnosis not present

## 2020-11-03 DIAGNOSIS — D509 Iron deficiency anemia, unspecified: Secondary | ICD-10-CM | POA: Diagnosis not present

## 2020-11-03 DIAGNOSIS — Z85038 Personal history of other malignant neoplasm of large intestine: Secondary | ICD-10-CM | POA: Diagnosis not present

## 2020-11-03 DIAGNOSIS — Z0000 Encounter for general adult medical examination without abnormal findings: Secondary | ICD-10-CM | POA: Diagnosis not present

## 2020-11-03 DIAGNOSIS — M8580 Other specified disorders of bone density and structure, unspecified site: Secondary | ICD-10-CM | POA: Diagnosis not present

## 2020-11-03 DIAGNOSIS — Z1389 Encounter for screening for other disorder: Secondary | ICD-10-CM | POA: Diagnosis not present

## 2020-11-05 DIAGNOSIS — K219 Gastro-esophageal reflux disease without esophagitis: Secondary | ICD-10-CM | POA: Diagnosis not present

## 2020-11-05 DIAGNOSIS — E7800 Pure hypercholesterolemia, unspecified: Secondary | ICD-10-CM | POA: Diagnosis not present

## 2020-11-05 DIAGNOSIS — E039 Hypothyroidism, unspecified: Secondary | ICD-10-CM | POA: Diagnosis not present

## 2020-11-05 DIAGNOSIS — E782 Mixed hyperlipidemia: Secondary | ICD-10-CM | POA: Diagnosis not present

## 2020-11-05 DIAGNOSIS — D509 Iron deficiency anemia, unspecified: Secondary | ICD-10-CM | POA: Diagnosis not present

## 2020-11-05 DIAGNOSIS — M8580 Other specified disorders of bone density and structure, unspecified site: Secondary | ICD-10-CM | POA: Diagnosis not present

## 2020-11-05 DIAGNOSIS — M189 Osteoarthritis of first carpometacarpal joint, unspecified: Secondary | ICD-10-CM | POA: Diagnosis not present

## 2020-11-05 DIAGNOSIS — I129 Hypertensive chronic kidney disease with stage 1 through stage 4 chronic kidney disease, or unspecified chronic kidney disease: Secondary | ICD-10-CM | POA: Diagnosis not present

## 2020-11-05 DIAGNOSIS — Z85038 Personal history of other malignant neoplasm of large intestine: Secondary | ICD-10-CM | POA: Diagnosis not present

## 2020-11-13 DIAGNOSIS — J3089 Other allergic rhinitis: Secondary | ICD-10-CM | POA: Diagnosis not present

## 2020-12-07 DIAGNOSIS — N1831 Chronic kidney disease, stage 3a: Secondary | ICD-10-CM | POA: Diagnosis not present

## 2020-12-07 DIAGNOSIS — M8580 Other specified disorders of bone density and structure, unspecified site: Secondary | ICD-10-CM | POA: Diagnosis not present

## 2020-12-07 DIAGNOSIS — I129 Hypertensive chronic kidney disease with stage 1 through stage 4 chronic kidney disease, or unspecified chronic kidney disease: Secondary | ICD-10-CM | POA: Diagnosis not present

## 2020-12-07 DIAGNOSIS — E782 Mixed hyperlipidemia: Secondary | ICD-10-CM | POA: Diagnosis not present

## 2020-12-07 DIAGNOSIS — K219 Gastro-esophageal reflux disease without esophagitis: Secondary | ICD-10-CM | POA: Diagnosis not present

## 2020-12-07 DIAGNOSIS — D509 Iron deficiency anemia, unspecified: Secondary | ICD-10-CM | POA: Diagnosis not present

## 2020-12-07 DIAGNOSIS — E039 Hypothyroidism, unspecified: Secondary | ICD-10-CM | POA: Diagnosis not present

## 2020-12-07 DIAGNOSIS — Z85038 Personal history of other malignant neoplasm of large intestine: Secondary | ICD-10-CM | POA: Diagnosis not present

## 2020-12-07 DIAGNOSIS — E7800 Pure hypercholesterolemia, unspecified: Secondary | ICD-10-CM | POA: Diagnosis not present

## 2020-12-07 DIAGNOSIS — M189 Osteoarthritis of first carpometacarpal joint, unspecified: Secondary | ICD-10-CM | POA: Diagnosis not present

## 2020-12-18 DIAGNOSIS — T63441D Toxic effect of venom of bees, accidental (unintentional), subsequent encounter: Secondary | ICD-10-CM | POA: Diagnosis not present

## 2020-12-18 DIAGNOSIS — J3089 Other allergic rhinitis: Secondary | ICD-10-CM | POA: Diagnosis not present

## 2021-01-01 DIAGNOSIS — E7800 Pure hypercholesterolemia, unspecified: Secondary | ICD-10-CM | POA: Diagnosis not present

## 2021-01-01 DIAGNOSIS — M8580 Other specified disorders of bone density and structure, unspecified site: Secondary | ICD-10-CM | POA: Diagnosis not present

## 2021-01-01 DIAGNOSIS — E039 Hypothyroidism, unspecified: Secondary | ICD-10-CM | POA: Diagnosis not present

## 2021-01-01 DIAGNOSIS — E782 Mixed hyperlipidemia: Secondary | ICD-10-CM | POA: Diagnosis not present

## 2021-01-01 DIAGNOSIS — M189 Osteoarthritis of first carpometacarpal joint, unspecified: Secondary | ICD-10-CM | POA: Diagnosis not present

## 2021-01-01 DIAGNOSIS — K219 Gastro-esophageal reflux disease without esophagitis: Secondary | ICD-10-CM | POA: Diagnosis not present

## 2021-01-01 DIAGNOSIS — D509 Iron deficiency anemia, unspecified: Secondary | ICD-10-CM | POA: Diagnosis not present

## 2021-01-01 DIAGNOSIS — I129 Hypertensive chronic kidney disease with stage 1 through stage 4 chronic kidney disease, or unspecified chronic kidney disease: Secondary | ICD-10-CM | POA: Diagnosis not present

## 2021-01-01 DIAGNOSIS — N1831 Chronic kidney disease, stage 3a: Secondary | ICD-10-CM | POA: Diagnosis not present

## 2021-01-19 DIAGNOSIS — J3089 Other allergic rhinitis: Secondary | ICD-10-CM | POA: Diagnosis not present

## 2021-01-19 DIAGNOSIS — T63451D Toxic effect of venom of hornets, accidental (unintentional), subsequent encounter: Secondary | ICD-10-CM | POA: Diagnosis not present

## 2021-02-03 DIAGNOSIS — M8580 Other specified disorders of bone density and structure, unspecified site: Secondary | ICD-10-CM | POA: Diagnosis not present

## 2021-02-03 DIAGNOSIS — N1831 Chronic kidney disease, stage 3a: Secondary | ICD-10-CM | POA: Diagnosis not present

## 2021-02-03 DIAGNOSIS — D509 Iron deficiency anemia, unspecified: Secondary | ICD-10-CM | POA: Diagnosis not present

## 2021-02-03 DIAGNOSIS — E039 Hypothyroidism, unspecified: Secondary | ICD-10-CM | POA: Diagnosis not present

## 2021-02-03 DIAGNOSIS — M189 Osteoarthritis of first carpometacarpal joint, unspecified: Secondary | ICD-10-CM | POA: Diagnosis not present

## 2021-02-03 DIAGNOSIS — J3089 Other allergic rhinitis: Secondary | ICD-10-CM | POA: Diagnosis not present

## 2021-02-03 DIAGNOSIS — E7800 Pure hypercholesterolemia, unspecified: Secondary | ICD-10-CM | POA: Diagnosis not present

## 2021-02-03 DIAGNOSIS — I129 Hypertensive chronic kidney disease with stage 1 through stage 4 chronic kidney disease, or unspecified chronic kidney disease: Secondary | ICD-10-CM | POA: Diagnosis not present

## 2021-02-03 DIAGNOSIS — E782 Mixed hyperlipidemia: Secondary | ICD-10-CM | POA: Diagnosis not present

## 2021-02-03 DIAGNOSIS — K219 Gastro-esophageal reflux disease without esophagitis: Secondary | ICD-10-CM | POA: Diagnosis not present

## 2021-02-18 DIAGNOSIS — T63451D Toxic effect of venom of hornets, accidental (unintentional), subsequent encounter: Secondary | ICD-10-CM | POA: Diagnosis not present

## 2021-02-18 DIAGNOSIS — J3089 Other allergic rhinitis: Secondary | ICD-10-CM | POA: Diagnosis not present

## 2021-03-01 DIAGNOSIS — K219 Gastro-esophageal reflux disease without esophagitis: Secondary | ICD-10-CM | POA: Diagnosis not present

## 2021-03-01 DIAGNOSIS — N1831 Chronic kidney disease, stage 3a: Secondary | ICD-10-CM | POA: Diagnosis not present

## 2021-03-01 DIAGNOSIS — E039 Hypothyroidism, unspecified: Secondary | ICD-10-CM | POA: Diagnosis not present

## 2021-03-01 DIAGNOSIS — E782 Mixed hyperlipidemia: Secondary | ICD-10-CM | POA: Diagnosis not present

## 2021-03-01 DIAGNOSIS — M8580 Other specified disorders of bone density and structure, unspecified site: Secondary | ICD-10-CM | POA: Diagnosis not present

## 2021-03-01 DIAGNOSIS — E7800 Pure hypercholesterolemia, unspecified: Secondary | ICD-10-CM | POA: Diagnosis not present

## 2021-03-01 DIAGNOSIS — D509 Iron deficiency anemia, unspecified: Secondary | ICD-10-CM | POA: Diagnosis not present

## 2021-03-01 DIAGNOSIS — M189 Osteoarthritis of first carpometacarpal joint, unspecified: Secondary | ICD-10-CM | POA: Diagnosis not present

## 2021-03-01 DIAGNOSIS — I129 Hypertensive chronic kidney disease with stage 1 through stage 4 chronic kidney disease, or unspecified chronic kidney disease: Secondary | ICD-10-CM | POA: Diagnosis not present

## 2021-03-09 DIAGNOSIS — J3089 Other allergic rhinitis: Secondary | ICD-10-CM | POA: Diagnosis not present

## 2021-03-09 DIAGNOSIS — H1045 Other chronic allergic conjunctivitis: Secondary | ICD-10-CM | POA: Diagnosis not present

## 2021-03-09 DIAGNOSIS — T63441D Toxic effect of venom of bees, accidental (unintentional), subsequent encounter: Secondary | ICD-10-CM | POA: Diagnosis not present

## 2021-03-09 DIAGNOSIS — Z91030 Bee allergy status: Secondary | ICD-10-CM | POA: Diagnosis not present

## 2021-03-24 DIAGNOSIS — Z23 Encounter for immunization: Secondary | ICD-10-CM | POA: Diagnosis not present

## 2021-03-26 DIAGNOSIS — T63441D Toxic effect of venom of bees, accidental (unintentional), subsequent encounter: Secondary | ICD-10-CM | POA: Diagnosis not present

## 2021-03-26 DIAGNOSIS — J3089 Other allergic rhinitis: Secondary | ICD-10-CM | POA: Diagnosis not present

## 2021-03-31 DIAGNOSIS — E782 Mixed hyperlipidemia: Secondary | ICD-10-CM | POA: Diagnosis not present

## 2021-03-31 DIAGNOSIS — N1831 Chronic kidney disease, stage 3a: Secondary | ICD-10-CM | POA: Diagnosis not present

## 2021-03-31 DIAGNOSIS — K219 Gastro-esophageal reflux disease without esophagitis: Secondary | ICD-10-CM | POA: Diagnosis not present

## 2021-03-31 DIAGNOSIS — E039 Hypothyroidism, unspecified: Secondary | ICD-10-CM | POA: Diagnosis not present

## 2021-03-31 DIAGNOSIS — D509 Iron deficiency anemia, unspecified: Secondary | ICD-10-CM | POA: Diagnosis not present

## 2021-03-31 DIAGNOSIS — E7800 Pure hypercholesterolemia, unspecified: Secondary | ICD-10-CM | POA: Diagnosis not present

## 2021-03-31 DIAGNOSIS — M189 Osteoarthritis of first carpometacarpal joint, unspecified: Secondary | ICD-10-CM | POA: Diagnosis not present

## 2021-03-31 DIAGNOSIS — I129 Hypertensive chronic kidney disease with stage 1 through stage 4 chronic kidney disease, or unspecified chronic kidney disease: Secondary | ICD-10-CM | POA: Diagnosis not present

## 2021-03-31 DIAGNOSIS — M8580 Other specified disorders of bone density and structure, unspecified site: Secondary | ICD-10-CM | POA: Diagnosis not present

## 2021-04-08 DIAGNOSIS — Z23 Encounter for immunization: Secondary | ICD-10-CM | POA: Diagnosis not present

## 2021-04-26 DIAGNOSIS — J3089 Other allergic rhinitis: Secondary | ICD-10-CM | POA: Diagnosis not present

## 2021-04-26 DIAGNOSIS — T63451D Toxic effect of venom of hornets, accidental (unintentional), subsequent encounter: Secondary | ICD-10-CM | POA: Diagnosis not present

## 2021-04-30 DIAGNOSIS — E039 Hypothyroidism, unspecified: Secondary | ICD-10-CM | POA: Diagnosis not present

## 2021-04-30 DIAGNOSIS — I129 Hypertensive chronic kidney disease with stage 1 through stage 4 chronic kidney disease, or unspecified chronic kidney disease: Secondary | ICD-10-CM | POA: Diagnosis not present

## 2021-04-30 DIAGNOSIS — N1831 Chronic kidney disease, stage 3a: Secondary | ICD-10-CM | POA: Diagnosis not present

## 2021-04-30 DIAGNOSIS — E7800 Pure hypercholesterolemia, unspecified: Secondary | ICD-10-CM | POA: Diagnosis not present

## 2021-04-30 DIAGNOSIS — M8580 Other specified disorders of bone density and structure, unspecified site: Secondary | ICD-10-CM | POA: Diagnosis not present

## 2021-04-30 DIAGNOSIS — K219 Gastro-esophageal reflux disease without esophagitis: Secondary | ICD-10-CM | POA: Diagnosis not present

## 2021-04-30 DIAGNOSIS — E782 Mixed hyperlipidemia: Secondary | ICD-10-CM | POA: Diagnosis not present

## 2021-04-30 DIAGNOSIS — D509 Iron deficiency anemia, unspecified: Secondary | ICD-10-CM | POA: Diagnosis not present

## 2021-04-30 DIAGNOSIS — M189 Osteoarthritis of first carpometacarpal joint, unspecified: Secondary | ICD-10-CM | POA: Diagnosis not present

## 2021-05-17 DIAGNOSIS — D509 Iron deficiency anemia, unspecified: Secondary | ICD-10-CM | POA: Diagnosis not present

## 2021-05-17 DIAGNOSIS — I700 Atherosclerosis of aorta: Secondary | ICD-10-CM | POA: Diagnosis not present

## 2021-05-17 DIAGNOSIS — K219 Gastro-esophageal reflux disease without esophagitis: Secondary | ICD-10-CM | POA: Diagnosis not present

## 2021-05-17 DIAGNOSIS — N1831 Chronic kidney disease, stage 3a: Secondary | ICD-10-CM | POA: Diagnosis not present

## 2021-05-17 DIAGNOSIS — Z85038 Personal history of other malignant neoplasm of large intestine: Secondary | ICD-10-CM | POA: Diagnosis not present

## 2021-05-17 DIAGNOSIS — E039 Hypothyroidism, unspecified: Secondary | ICD-10-CM | POA: Diagnosis not present

## 2021-05-17 DIAGNOSIS — M8580 Other specified disorders of bone density and structure, unspecified site: Secondary | ICD-10-CM | POA: Diagnosis not present

## 2021-05-17 DIAGNOSIS — I129 Hypertensive chronic kidney disease with stage 1 through stage 4 chronic kidney disease, or unspecified chronic kidney disease: Secondary | ICD-10-CM | POA: Diagnosis not present

## 2021-05-17 DIAGNOSIS — E7800 Pure hypercholesterolemia, unspecified: Secondary | ICD-10-CM | POA: Diagnosis not present

## 2021-05-17 DIAGNOSIS — H6121 Impacted cerumen, right ear: Secondary | ICD-10-CM | POA: Diagnosis not present

## 2021-05-27 DIAGNOSIS — J3089 Other allergic rhinitis: Secondary | ICD-10-CM | POA: Diagnosis not present

## 2021-05-27 DIAGNOSIS — T63451D Toxic effect of venom of hornets, accidental (unintentional), subsequent encounter: Secondary | ICD-10-CM | POA: Diagnosis not present

## 2021-06-04 DIAGNOSIS — E7800 Pure hypercholesterolemia, unspecified: Secondary | ICD-10-CM | POA: Diagnosis not present

## 2021-06-04 DIAGNOSIS — D509 Iron deficiency anemia, unspecified: Secondary | ICD-10-CM | POA: Diagnosis not present

## 2021-06-04 DIAGNOSIS — Z1231 Encounter for screening mammogram for malignant neoplasm of breast: Secondary | ICD-10-CM | POA: Diagnosis not present

## 2021-06-04 DIAGNOSIS — M189 Osteoarthritis of first carpometacarpal joint, unspecified: Secondary | ICD-10-CM | POA: Diagnosis not present

## 2021-06-04 DIAGNOSIS — I129 Hypertensive chronic kidney disease with stage 1 through stage 4 chronic kidney disease, or unspecified chronic kidney disease: Secondary | ICD-10-CM | POA: Diagnosis not present

## 2021-06-04 DIAGNOSIS — E782 Mixed hyperlipidemia: Secondary | ICD-10-CM | POA: Diagnosis not present

## 2021-06-04 DIAGNOSIS — K219 Gastro-esophageal reflux disease without esophagitis: Secondary | ICD-10-CM | POA: Diagnosis not present

## 2021-06-04 DIAGNOSIS — N1831 Chronic kidney disease, stage 3a: Secondary | ICD-10-CM | POA: Diagnosis not present

## 2021-06-04 DIAGNOSIS — E039 Hypothyroidism, unspecified: Secondary | ICD-10-CM | POA: Diagnosis not present

## 2021-06-04 DIAGNOSIS — J3089 Other allergic rhinitis: Secondary | ICD-10-CM | POA: Diagnosis not present

## 2021-06-04 DIAGNOSIS — M8580 Other specified disorders of bone density and structure, unspecified site: Secondary | ICD-10-CM | POA: Diagnosis not present

## 2021-06-28 DIAGNOSIS — T63451D Toxic effect of venom of hornets, accidental (unintentional), subsequent encounter: Secondary | ICD-10-CM | POA: Diagnosis not present

## 2021-06-28 DIAGNOSIS — J3089 Other allergic rhinitis: Secondary | ICD-10-CM | POA: Diagnosis not present

## 2021-07-02 DIAGNOSIS — J3089 Other allergic rhinitis: Secondary | ICD-10-CM | POA: Diagnosis not present

## 2021-07-05 DIAGNOSIS — J3089 Other allergic rhinitis: Secondary | ICD-10-CM | POA: Diagnosis not present

## 2021-07-07 DIAGNOSIS — J3089 Other allergic rhinitis: Secondary | ICD-10-CM | POA: Diagnosis not present

## 2021-07-13 DIAGNOSIS — J3089 Other allergic rhinitis: Secondary | ICD-10-CM | POA: Diagnosis not present

## 2021-08-11 DIAGNOSIS — T63441D Toxic effect of venom of bees, accidental (unintentional), subsequent encounter: Secondary | ICD-10-CM | POA: Diagnosis not present

## 2021-08-11 DIAGNOSIS — J3089 Other allergic rhinitis: Secondary | ICD-10-CM | POA: Diagnosis not present

## 2021-08-16 DIAGNOSIS — T63451D Toxic effect of venom of hornets, accidental (unintentional), subsequent encounter: Secondary | ICD-10-CM | POA: Diagnosis not present

## 2021-08-23 DIAGNOSIS — T63441D Toxic effect of venom of bees, accidental (unintentional), subsequent encounter: Secondary | ICD-10-CM | POA: Diagnosis not present

## 2021-08-23 DIAGNOSIS — Z20822 Contact with and (suspected) exposure to covid-19: Secondary | ICD-10-CM | POA: Diagnosis not present

## 2021-08-26 DIAGNOSIS — E039 Hypothyroidism, unspecified: Secondary | ICD-10-CM | POA: Diagnosis not present

## 2021-08-26 DIAGNOSIS — E782 Mixed hyperlipidemia: Secondary | ICD-10-CM | POA: Diagnosis not present

## 2021-08-26 DIAGNOSIS — I10 Essential (primary) hypertension: Secondary | ICD-10-CM | POA: Diagnosis not present

## 2021-09-09 DIAGNOSIS — J3089 Other allergic rhinitis: Secondary | ICD-10-CM | POA: Diagnosis not present

## 2021-09-22 DIAGNOSIS — J45901 Unspecified asthma with (acute) exacerbation: Secondary | ICD-10-CM | POA: Diagnosis not present

## 2021-09-22 DIAGNOSIS — T63461D Toxic effect of venom of wasps, accidental (unintentional), subsequent encounter: Secondary | ICD-10-CM | POA: Diagnosis not present

## 2021-09-22 DIAGNOSIS — J45909 Unspecified asthma, uncomplicated: Secondary | ICD-10-CM | POA: Diagnosis not present

## 2021-10-20 DIAGNOSIS — T63451D Toxic effect of venom of hornets, accidental (unintentional), subsequent encounter: Secondary | ICD-10-CM | POA: Diagnosis not present

## 2021-11-05 DIAGNOSIS — Z961 Presence of intraocular lens: Secondary | ICD-10-CM | POA: Diagnosis not present

## 2021-11-05 DIAGNOSIS — H43813 Vitreous degeneration, bilateral: Secondary | ICD-10-CM | POA: Diagnosis not present

## 2021-11-05 DIAGNOSIS — H5213 Myopia, bilateral: Secondary | ICD-10-CM | POA: Diagnosis not present

## 2021-11-05 DIAGNOSIS — H04123 Dry eye syndrome of bilateral lacrimal glands: Secondary | ICD-10-CM | POA: Diagnosis not present

## 2021-11-11 DIAGNOSIS — J3089 Other allergic rhinitis: Secondary | ICD-10-CM | POA: Diagnosis not present

## 2021-11-17 DIAGNOSIS — Z1331 Encounter for screening for depression: Secondary | ICD-10-CM | POA: Diagnosis not present

## 2021-11-17 DIAGNOSIS — Z0000 Encounter for general adult medical examination without abnormal findings: Secondary | ICD-10-CM | POA: Diagnosis not present

## 2021-11-17 DIAGNOSIS — E039 Hypothyroidism, unspecified: Secondary | ICD-10-CM | POA: Diagnosis not present

## 2021-11-17 DIAGNOSIS — I129 Hypertensive chronic kidney disease with stage 1 through stage 4 chronic kidney disease, or unspecified chronic kidney disease: Secondary | ICD-10-CM | POA: Diagnosis not present

## 2021-11-17 DIAGNOSIS — K219 Gastro-esophageal reflux disease without esophagitis: Secondary | ICD-10-CM | POA: Diagnosis not present

## 2021-11-17 DIAGNOSIS — E7800 Pure hypercholesterolemia, unspecified: Secondary | ICD-10-CM | POA: Diagnosis not present

## 2021-11-17 DIAGNOSIS — N1831 Chronic kidney disease, stage 3a: Secondary | ICD-10-CM | POA: Diagnosis not present

## 2021-11-17 DIAGNOSIS — I700 Atherosclerosis of aorta: Secondary | ICD-10-CM | POA: Diagnosis not present

## 2021-11-17 DIAGNOSIS — J45901 Unspecified asthma with (acute) exacerbation: Secondary | ICD-10-CM | POA: Diagnosis not present

## 2021-11-17 DIAGNOSIS — M8580 Other specified disorders of bone density and structure, unspecified site: Secondary | ICD-10-CM | POA: Diagnosis not present

## 2021-11-17 DIAGNOSIS — D509 Iron deficiency anemia, unspecified: Secondary | ICD-10-CM | POA: Diagnosis not present

## 2021-11-17 DIAGNOSIS — Z85038 Personal history of other malignant neoplasm of large intestine: Secondary | ICD-10-CM | POA: Diagnosis not present

## 2021-11-26 DIAGNOSIS — I10 Essential (primary) hypertension: Secondary | ICD-10-CM | POA: Diagnosis not present

## 2021-11-26 DIAGNOSIS — E782 Mixed hyperlipidemia: Secondary | ICD-10-CM | POA: Diagnosis not present

## 2021-11-26 DIAGNOSIS — E039 Hypothyroidism, unspecified: Secondary | ICD-10-CM | POA: Diagnosis not present

## 2021-12-08 DIAGNOSIS — L820 Inflamed seborrheic keratosis: Secondary | ICD-10-CM | POA: Diagnosis not present

## 2021-12-14 DIAGNOSIS — J3081 Allergic rhinitis due to animal (cat) (dog) hair and dander: Secondary | ICD-10-CM | POA: Diagnosis not present

## 2021-12-14 DIAGNOSIS — J3089 Other allergic rhinitis: Secondary | ICD-10-CM | POA: Diagnosis not present

## 2021-12-14 DIAGNOSIS — J301 Allergic rhinitis due to pollen: Secondary | ICD-10-CM | POA: Diagnosis not present

## 2021-12-17 DIAGNOSIS — U071 COVID-19: Secondary | ICD-10-CM | POA: Diagnosis not present

## 2021-12-17 DIAGNOSIS — R051 Acute cough: Secondary | ICD-10-CM | POA: Diagnosis not present

## 2021-12-27 DIAGNOSIS — T63441D Toxic effect of venom of bees, accidental (unintentional), subsequent encounter: Secondary | ICD-10-CM | POA: Diagnosis not present

## 2021-12-27 DIAGNOSIS — T63461D Toxic effect of venom of wasps, accidental (unintentional), subsequent encounter: Secondary | ICD-10-CM | POA: Diagnosis not present

## 2021-12-27 DIAGNOSIS — T63451D Toxic effect of venom of hornets, accidental (unintentional), subsequent encounter: Secondary | ICD-10-CM | POA: Diagnosis not present

## 2022-01-03 DIAGNOSIS — J3089 Other allergic rhinitis: Secondary | ICD-10-CM | POA: Diagnosis not present

## 2022-01-03 DIAGNOSIS — J301 Allergic rhinitis due to pollen: Secondary | ICD-10-CM | POA: Diagnosis not present

## 2022-01-03 DIAGNOSIS — J3081 Allergic rhinitis due to animal (cat) (dog) hair and dander: Secondary | ICD-10-CM | POA: Diagnosis not present

## 2022-01-10 DIAGNOSIS — T63451D Toxic effect of venom of hornets, accidental (unintentional), subsequent encounter: Secondary | ICD-10-CM | POA: Diagnosis not present

## 2022-01-10 DIAGNOSIS — T63461D Toxic effect of venom of wasps, accidental (unintentional), subsequent encounter: Secondary | ICD-10-CM | POA: Diagnosis not present

## 2022-01-10 DIAGNOSIS — T63441D Toxic effect of venom of bees, accidental (unintentional), subsequent encounter: Secondary | ICD-10-CM | POA: Diagnosis not present

## 2022-01-13 DIAGNOSIS — J301 Allergic rhinitis due to pollen: Secondary | ICD-10-CM | POA: Diagnosis not present

## 2022-01-13 DIAGNOSIS — J3081 Allergic rhinitis due to animal (cat) (dog) hair and dander: Secondary | ICD-10-CM | POA: Diagnosis not present

## 2022-01-13 DIAGNOSIS — J3089 Other allergic rhinitis: Secondary | ICD-10-CM | POA: Diagnosis not present

## 2022-01-24 DIAGNOSIS — J3089 Other allergic rhinitis: Secondary | ICD-10-CM | POA: Diagnosis not present

## 2022-02-01 DIAGNOSIS — T63451D Toxic effect of venom of hornets, accidental (unintentional), subsequent encounter: Secondary | ICD-10-CM | POA: Diagnosis not present

## 2022-02-01 DIAGNOSIS — T63461D Toxic effect of venom of wasps, accidental (unintentional), subsequent encounter: Secondary | ICD-10-CM | POA: Diagnosis not present

## 2022-02-01 DIAGNOSIS — T63441D Toxic effect of venom of bees, accidental (unintentional), subsequent encounter: Secondary | ICD-10-CM | POA: Diagnosis not present

## 2022-02-08 DIAGNOSIS — T63461D Toxic effect of venom of wasps, accidental (unintentional), subsequent encounter: Secondary | ICD-10-CM | POA: Diagnosis not present

## 2022-02-08 DIAGNOSIS — T63441D Toxic effect of venom of bees, accidental (unintentional), subsequent encounter: Secondary | ICD-10-CM | POA: Diagnosis not present

## 2022-02-08 DIAGNOSIS — T63451D Toxic effect of venom of hornets, accidental (unintentional), subsequent encounter: Secondary | ICD-10-CM | POA: Diagnosis not present

## 2022-02-14 DIAGNOSIS — J3089 Other allergic rhinitis: Secondary | ICD-10-CM | POA: Diagnosis not present

## 2022-02-14 DIAGNOSIS — T63451D Toxic effect of venom of hornets, accidental (unintentional), subsequent encounter: Secondary | ICD-10-CM | POA: Diagnosis not present

## 2022-03-09 DIAGNOSIS — T63441D Toxic effect of venom of bees, accidental (unintentional), subsequent encounter: Secondary | ICD-10-CM | POA: Diagnosis not present

## 2022-03-09 DIAGNOSIS — J3089 Other allergic rhinitis: Secondary | ICD-10-CM | POA: Diagnosis not present

## 2022-03-09 DIAGNOSIS — Z91030 Bee allergy status: Secondary | ICD-10-CM | POA: Diagnosis not present

## 2022-03-09 DIAGNOSIS — H1045 Other chronic allergic conjunctivitis: Secondary | ICD-10-CM | POA: Diagnosis not present

## 2022-03-16 ENCOUNTER — Other Ambulatory Visit: Payer: Self-pay | Admitting: Allergy and Immunology

## 2022-03-16 ENCOUNTER — Ambulatory Visit
Admission: RE | Admit: 2022-03-16 | Discharge: 2022-03-16 | Disposition: A | Discharge status: Home / Self Care | Payer: Medicare HMO | Source: Ambulatory Visit | Attending: Allergy and Immunology | Admitting: Allergy and Immunology

## 2022-03-16 DIAGNOSIS — R059 Cough, unspecified: Secondary | ICD-10-CM | POA: Diagnosis not present

## 2022-03-16 DIAGNOSIS — H1045 Other chronic allergic conjunctivitis: Secondary | ICD-10-CM | POA: Diagnosis not present

## 2022-03-16 DIAGNOSIS — T63441D Toxic effect of venom of bees, accidental (unintentional), subsequent encounter: Secondary | ICD-10-CM | POA: Diagnosis not present

## 2022-03-16 DIAGNOSIS — J3089 Other allergic rhinitis: Secondary | ICD-10-CM | POA: Diagnosis not present

## 2022-03-16 DIAGNOSIS — Z91030 Bee allergy status: Secondary | ICD-10-CM | POA: Diagnosis not present

## 2022-03-16 DIAGNOSIS — R0602 Shortness of breath: Secondary | ICD-10-CM | POA: Diagnosis not present

## 2022-03-24 DIAGNOSIS — T63441D Toxic effect of venom of bees, accidental (unintentional), subsequent encounter: Secondary | ICD-10-CM | POA: Diagnosis not present

## 2022-03-24 DIAGNOSIS — T63461D Toxic effect of venom of wasps, accidental (unintentional), subsequent encounter: Secondary | ICD-10-CM | POA: Diagnosis not present

## 2022-03-24 DIAGNOSIS — T63451D Toxic effect of venom of hornets, accidental (unintentional), subsequent encounter: Secondary | ICD-10-CM | POA: Diagnosis not present

## 2022-03-29 DIAGNOSIS — J3089 Other allergic rhinitis: Secondary | ICD-10-CM | POA: Diagnosis not present

## 2022-03-29 DIAGNOSIS — J301 Allergic rhinitis due to pollen: Secondary | ICD-10-CM | POA: Diagnosis not present

## 2022-03-29 DIAGNOSIS — J3081 Allergic rhinitis due to animal (cat) (dog) hair and dander: Secondary | ICD-10-CM | POA: Diagnosis not present

## 2022-03-30 DIAGNOSIS — Z85038 Personal history of other malignant neoplasm of large intestine: Secondary | ICD-10-CM | POA: Diagnosis not present

## 2022-03-30 DIAGNOSIS — Z8249 Family history of ischemic heart disease and other diseases of the circulatory system: Secondary | ICD-10-CM | POA: Diagnosis not present

## 2022-03-30 DIAGNOSIS — Z833 Family history of diabetes mellitus: Secondary | ICD-10-CM | POA: Diagnosis not present

## 2022-03-30 DIAGNOSIS — M8580 Other specified disorders of bone density and structure, unspecified site: Secondary | ICD-10-CM | POA: Diagnosis not present

## 2022-03-30 DIAGNOSIS — R69 Illness, unspecified: Secondary | ICD-10-CM | POA: Diagnosis not present

## 2022-03-30 DIAGNOSIS — E785 Hyperlipidemia, unspecified: Secondary | ICD-10-CM | POA: Diagnosis not present

## 2022-03-30 DIAGNOSIS — K219 Gastro-esophageal reflux disease without esophagitis: Secondary | ICD-10-CM | POA: Diagnosis not present

## 2022-03-30 DIAGNOSIS — Z825 Family history of asthma and other chronic lower respiratory diseases: Secondary | ICD-10-CM | POA: Diagnosis not present

## 2022-03-30 DIAGNOSIS — Z809 Family history of malignant neoplasm, unspecified: Secondary | ICD-10-CM | POA: Diagnosis not present

## 2022-03-30 DIAGNOSIS — E039 Hypothyroidism, unspecified: Secondary | ICD-10-CM | POA: Diagnosis not present

## 2022-03-30 DIAGNOSIS — I10 Essential (primary) hypertension: Secondary | ICD-10-CM | POA: Diagnosis not present

## 2022-03-30 DIAGNOSIS — R32 Unspecified urinary incontinence: Secondary | ICD-10-CM | POA: Diagnosis not present

## 2022-03-31 DIAGNOSIS — T63451D Toxic effect of venom of hornets, accidental (unintentional), subsequent encounter: Secondary | ICD-10-CM | POA: Diagnosis not present

## 2022-04-05 DIAGNOSIS — J301 Allergic rhinitis due to pollen: Secondary | ICD-10-CM | POA: Diagnosis not present

## 2022-04-05 DIAGNOSIS — J3081 Allergic rhinitis due to animal (cat) (dog) hair and dander: Secondary | ICD-10-CM | POA: Diagnosis not present

## 2022-04-05 DIAGNOSIS — J3089 Other allergic rhinitis: Secondary | ICD-10-CM | POA: Diagnosis not present

## 2022-04-12 DIAGNOSIS — J3089 Other allergic rhinitis: Secondary | ICD-10-CM | POA: Diagnosis not present

## 2022-04-12 DIAGNOSIS — J301 Allergic rhinitis due to pollen: Secondary | ICD-10-CM | POA: Diagnosis not present

## 2022-04-12 DIAGNOSIS — J3081 Allergic rhinitis due to animal (cat) (dog) hair and dander: Secondary | ICD-10-CM | POA: Diagnosis not present

## 2022-04-13 DIAGNOSIS — L821 Other seborrheic keratosis: Secondary | ICD-10-CM | POA: Diagnosis not present

## 2022-04-13 DIAGNOSIS — Z1283 Encounter for screening for malignant neoplasm of skin: Secondary | ICD-10-CM | POA: Diagnosis not present

## 2022-04-19 DIAGNOSIS — J3081 Allergic rhinitis due to animal (cat) (dog) hair and dander: Secondary | ICD-10-CM | POA: Diagnosis not present

## 2022-04-19 DIAGNOSIS — J3089 Other allergic rhinitis: Secondary | ICD-10-CM | POA: Diagnosis not present

## 2022-04-19 DIAGNOSIS — J301 Allergic rhinitis due to pollen: Secondary | ICD-10-CM | POA: Diagnosis not present

## 2022-04-25 DIAGNOSIS — J3081 Allergic rhinitis due to animal (cat) (dog) hair and dander: Secondary | ICD-10-CM | POA: Diagnosis not present

## 2022-04-25 DIAGNOSIS — J301 Allergic rhinitis due to pollen: Secondary | ICD-10-CM | POA: Diagnosis not present

## 2022-04-25 DIAGNOSIS — J3089 Other allergic rhinitis: Secondary | ICD-10-CM | POA: Diagnosis not present

## 2022-05-02 DIAGNOSIS — J3081 Allergic rhinitis due to animal (cat) (dog) hair and dander: Secondary | ICD-10-CM | POA: Diagnosis not present

## 2022-05-02 DIAGNOSIS — J301 Allergic rhinitis due to pollen: Secondary | ICD-10-CM | POA: Diagnosis not present

## 2022-05-02 DIAGNOSIS — J3089 Other allergic rhinitis: Secondary | ICD-10-CM | POA: Diagnosis not present

## 2022-05-02 DIAGNOSIS — T63461D Toxic effect of venom of wasps, accidental (unintentional), subsequent encounter: Secondary | ICD-10-CM | POA: Diagnosis not present

## 2022-05-18 DIAGNOSIS — I10 Essential (primary) hypertension: Secondary | ICD-10-CM | POA: Diagnosis not present

## 2022-05-18 DIAGNOSIS — D509 Iron deficiency anemia, unspecified: Secondary | ICD-10-CM | POA: Diagnosis not present

## 2022-05-18 DIAGNOSIS — I700 Atherosclerosis of aorta: Secondary | ICD-10-CM | POA: Diagnosis not present

## 2022-05-31 DIAGNOSIS — J301 Allergic rhinitis due to pollen: Secondary | ICD-10-CM | POA: Diagnosis not present

## 2022-05-31 DIAGNOSIS — J3081 Allergic rhinitis due to animal (cat) (dog) hair and dander: Secondary | ICD-10-CM | POA: Diagnosis not present

## 2022-05-31 DIAGNOSIS — J3089 Other allergic rhinitis: Secondary | ICD-10-CM | POA: Diagnosis not present

## 2022-05-31 DIAGNOSIS — T63441D Toxic effect of venom of bees, accidental (unintentional), subsequent encounter: Secondary | ICD-10-CM | POA: Diagnosis not present

## 2022-06-13 DIAGNOSIS — Z1231 Encounter for screening mammogram for malignant neoplasm of breast: Secondary | ICD-10-CM | POA: Diagnosis not present

## 2022-06-29 DIAGNOSIS — T63451D Toxic effect of venom of hornets, accidental (unintentional), subsequent encounter: Secondary | ICD-10-CM | POA: Diagnosis not present

## 2022-06-29 DIAGNOSIS — J301 Allergic rhinitis due to pollen: Secondary | ICD-10-CM | POA: Diagnosis not present

## 2022-06-29 DIAGNOSIS — T63441D Toxic effect of venom of bees, accidental (unintentional), subsequent encounter: Secondary | ICD-10-CM | POA: Diagnosis not present

## 2022-07-12 DIAGNOSIS — J209 Acute bronchitis, unspecified: Secondary | ICD-10-CM | POA: Diagnosis not present

## 2022-08-02 DIAGNOSIS — T63421D Toxic effect of venom of ants, accidental (unintentional), subsequent encounter: Secondary | ICD-10-CM | POA: Diagnosis not present

## 2022-08-02 DIAGNOSIS — T63451D Toxic effect of venom of hornets, accidental (unintentional), subsequent encounter: Secondary | ICD-10-CM | POA: Diagnosis not present

## 2022-08-02 DIAGNOSIS — T63441D Toxic effect of venom of bees, accidental (unintentional), subsequent encounter: Secondary | ICD-10-CM | POA: Diagnosis not present

## 2022-08-03 DIAGNOSIS — H1045 Other chronic allergic conjunctivitis: Secondary | ICD-10-CM | POA: Diagnosis not present

## 2022-08-03 DIAGNOSIS — J3089 Other allergic rhinitis: Secondary | ICD-10-CM | POA: Diagnosis not present

## 2022-08-03 DIAGNOSIS — R052 Subacute cough: Secondary | ICD-10-CM | POA: Diagnosis not present

## 2022-08-03 DIAGNOSIS — Z91030 Bee allergy status: Secondary | ICD-10-CM | POA: Diagnosis not present

## 2022-08-03 DIAGNOSIS — T63441D Toxic effect of venom of bees, accidental (unintentional), subsequent encounter: Secondary | ICD-10-CM | POA: Diagnosis not present

## 2022-08-03 DIAGNOSIS — J019 Acute sinusitis, unspecified: Secondary | ICD-10-CM | POA: Diagnosis not present

## 2022-08-31 DIAGNOSIS — J3089 Other allergic rhinitis: Secondary | ICD-10-CM | POA: Diagnosis not present

## 2022-08-31 DIAGNOSIS — T63441D Toxic effect of venom of bees, accidental (unintentional), subsequent encounter: Secondary | ICD-10-CM | POA: Diagnosis not present

## 2022-08-31 DIAGNOSIS — T63451D Toxic effect of venom of hornets, accidental (unintentional), subsequent encounter: Secondary | ICD-10-CM | POA: Diagnosis not present

## 2022-08-31 DIAGNOSIS — T63461D Toxic effect of venom of wasps, accidental (unintentional), subsequent encounter: Secondary | ICD-10-CM | POA: Diagnosis not present

## 2022-09-05 DIAGNOSIS — J3089 Other allergic rhinitis: Secondary | ICD-10-CM | POA: Diagnosis not present

## 2022-09-05 DIAGNOSIS — J301 Allergic rhinitis due to pollen: Secondary | ICD-10-CM | POA: Diagnosis not present

## 2022-09-05 DIAGNOSIS — J3081 Allergic rhinitis due to animal (cat) (dog) hair and dander: Secondary | ICD-10-CM | POA: Diagnosis not present

## 2022-09-12 DIAGNOSIS — J301 Allergic rhinitis due to pollen: Secondary | ICD-10-CM | POA: Diagnosis not present

## 2022-09-12 DIAGNOSIS — J3089 Other allergic rhinitis: Secondary | ICD-10-CM | POA: Diagnosis not present

## 2022-09-12 DIAGNOSIS — J3081 Allergic rhinitis due to animal (cat) (dog) hair and dander: Secondary | ICD-10-CM | POA: Diagnosis not present

## 2022-09-29 DIAGNOSIS — T63441D Toxic effect of venom of bees, accidental (unintentional), subsequent encounter: Secondary | ICD-10-CM | POA: Diagnosis not present

## 2022-09-29 DIAGNOSIS — T63451D Toxic effect of venom of hornets, accidental (unintentional), subsequent encounter: Secondary | ICD-10-CM | POA: Diagnosis not present

## 2022-09-29 DIAGNOSIS — T63461D Toxic effect of venom of wasps, accidental (unintentional), subsequent encounter: Secondary | ICD-10-CM | POA: Diagnosis not present

## 2022-10-06 DIAGNOSIS — T63441D Toxic effect of venom of bees, accidental (unintentional), subsequent encounter: Secondary | ICD-10-CM | POA: Diagnosis not present

## 2022-10-06 DIAGNOSIS — Z91030 Bee allergy status: Secondary | ICD-10-CM | POA: Diagnosis not present

## 2022-10-06 DIAGNOSIS — J3089 Other allergic rhinitis: Secondary | ICD-10-CM | POA: Diagnosis not present

## 2022-10-06 DIAGNOSIS — H1045 Other chronic allergic conjunctivitis: Secondary | ICD-10-CM | POA: Diagnosis not present

## 2022-10-19 DIAGNOSIS — J3089 Other allergic rhinitis: Secondary | ICD-10-CM | POA: Diagnosis not present

## 2022-10-19 DIAGNOSIS — J301 Allergic rhinitis due to pollen: Secondary | ICD-10-CM | POA: Diagnosis not present

## 2022-10-20 DIAGNOSIS — J3089 Other allergic rhinitis: Secondary | ICD-10-CM | POA: Diagnosis not present

## 2022-10-20 DIAGNOSIS — J3081 Allergic rhinitis due to animal (cat) (dog) hair and dander: Secondary | ICD-10-CM | POA: Diagnosis not present

## 2022-10-20 DIAGNOSIS — J301 Allergic rhinitis due to pollen: Secondary | ICD-10-CM | POA: Diagnosis not present

## 2022-10-24 DIAGNOSIS — J301 Allergic rhinitis due to pollen: Secondary | ICD-10-CM | POA: Diagnosis not present

## 2022-10-24 DIAGNOSIS — J3089 Other allergic rhinitis: Secondary | ICD-10-CM | POA: Diagnosis not present

## 2022-10-24 DIAGNOSIS — J3081 Allergic rhinitis due to animal (cat) (dog) hair and dander: Secondary | ICD-10-CM | POA: Diagnosis not present

## 2022-10-26 DIAGNOSIS — J3081 Allergic rhinitis due to animal (cat) (dog) hair and dander: Secondary | ICD-10-CM | POA: Diagnosis not present

## 2022-10-26 DIAGNOSIS — J301 Allergic rhinitis due to pollen: Secondary | ICD-10-CM | POA: Diagnosis not present

## 2022-10-26 DIAGNOSIS — J3089 Other allergic rhinitis: Secondary | ICD-10-CM | POA: Diagnosis not present

## 2022-10-28 DIAGNOSIS — J301 Allergic rhinitis due to pollen: Secondary | ICD-10-CM | POA: Diagnosis not present

## 2022-10-28 DIAGNOSIS — J3089 Other allergic rhinitis: Secondary | ICD-10-CM | POA: Diagnosis not present

## 2022-10-28 DIAGNOSIS — J3081 Allergic rhinitis due to animal (cat) (dog) hair and dander: Secondary | ICD-10-CM | POA: Diagnosis not present

## 2022-10-31 DIAGNOSIS — T63441D Toxic effect of venom of bees, accidental (unintentional), subsequent encounter: Secondary | ICD-10-CM | POA: Diagnosis not present

## 2022-10-31 DIAGNOSIS — T63451D Toxic effect of venom of hornets, accidental (unintentional), subsequent encounter: Secondary | ICD-10-CM | POA: Diagnosis not present

## 2022-10-31 DIAGNOSIS — T63461D Toxic effect of venom of wasps, accidental (unintentional), subsequent encounter: Secondary | ICD-10-CM | POA: Diagnosis not present

## 2022-11-02 DIAGNOSIS — J3081 Allergic rhinitis due to animal (cat) (dog) hair and dander: Secondary | ICD-10-CM | POA: Diagnosis not present

## 2022-11-02 DIAGNOSIS — J3089 Other allergic rhinitis: Secondary | ICD-10-CM | POA: Diagnosis not present

## 2022-11-02 DIAGNOSIS — J301 Allergic rhinitis due to pollen: Secondary | ICD-10-CM | POA: Diagnosis not present

## 2022-11-04 DIAGNOSIS — J3089 Other allergic rhinitis: Secondary | ICD-10-CM | POA: Diagnosis not present

## 2022-11-04 DIAGNOSIS — J301 Allergic rhinitis due to pollen: Secondary | ICD-10-CM | POA: Diagnosis not present

## 2022-11-04 DIAGNOSIS — J3081 Allergic rhinitis due to animal (cat) (dog) hair and dander: Secondary | ICD-10-CM | POA: Diagnosis not present

## 2022-11-07 DIAGNOSIS — J3089 Other allergic rhinitis: Secondary | ICD-10-CM | POA: Diagnosis not present

## 2022-11-07 DIAGNOSIS — J301 Allergic rhinitis due to pollen: Secondary | ICD-10-CM | POA: Diagnosis not present

## 2022-11-07 DIAGNOSIS — J3081 Allergic rhinitis due to animal (cat) (dog) hair and dander: Secondary | ICD-10-CM | POA: Diagnosis not present

## 2022-11-09 DIAGNOSIS — J301 Allergic rhinitis due to pollen: Secondary | ICD-10-CM | POA: Diagnosis not present

## 2022-11-09 DIAGNOSIS — J3089 Other allergic rhinitis: Secondary | ICD-10-CM | POA: Diagnosis not present

## 2022-11-09 DIAGNOSIS — J3081 Allergic rhinitis due to animal (cat) (dog) hair and dander: Secondary | ICD-10-CM | POA: Diagnosis not present

## 2022-11-11 DIAGNOSIS — J3081 Allergic rhinitis due to animal (cat) (dog) hair and dander: Secondary | ICD-10-CM | POA: Diagnosis not present

## 2022-11-11 DIAGNOSIS — J301 Allergic rhinitis due to pollen: Secondary | ICD-10-CM | POA: Diagnosis not present

## 2022-11-11 DIAGNOSIS — J3089 Other allergic rhinitis: Secondary | ICD-10-CM | POA: Diagnosis not present

## 2022-11-14 DIAGNOSIS — J3081 Allergic rhinitis due to animal (cat) (dog) hair and dander: Secondary | ICD-10-CM | POA: Diagnosis not present

## 2022-11-14 DIAGNOSIS — J3089 Other allergic rhinitis: Secondary | ICD-10-CM | POA: Diagnosis not present

## 2022-11-14 DIAGNOSIS — J301 Allergic rhinitis due to pollen: Secondary | ICD-10-CM | POA: Diagnosis not present

## 2022-11-17 DIAGNOSIS — J301 Allergic rhinitis due to pollen: Secondary | ICD-10-CM | POA: Diagnosis not present

## 2022-11-17 DIAGNOSIS — J3089 Other allergic rhinitis: Secondary | ICD-10-CM | POA: Diagnosis not present

## 2022-11-17 DIAGNOSIS — J3081 Allergic rhinitis due to animal (cat) (dog) hair and dander: Secondary | ICD-10-CM | POA: Diagnosis not present

## 2022-11-21 DIAGNOSIS — J3089 Other allergic rhinitis: Secondary | ICD-10-CM | POA: Diagnosis not present

## 2022-11-21 DIAGNOSIS — J3081 Allergic rhinitis due to animal (cat) (dog) hair and dander: Secondary | ICD-10-CM | POA: Diagnosis not present

## 2022-11-21 DIAGNOSIS — J301 Allergic rhinitis due to pollen: Secondary | ICD-10-CM | POA: Diagnosis not present

## 2022-11-24 DIAGNOSIS — J3081 Allergic rhinitis due to animal (cat) (dog) hair and dander: Secondary | ICD-10-CM | POA: Diagnosis not present

## 2022-11-24 DIAGNOSIS — J3089 Other allergic rhinitis: Secondary | ICD-10-CM | POA: Diagnosis not present

## 2022-11-24 DIAGNOSIS — J301 Allergic rhinitis due to pollen: Secondary | ICD-10-CM | POA: Diagnosis not present

## 2022-11-25 DIAGNOSIS — R3915 Urgency of urination: Secondary | ICD-10-CM | POA: Diagnosis not present

## 2022-11-25 DIAGNOSIS — R300 Dysuria: Secondary | ICD-10-CM | POA: Diagnosis not present

## 2022-11-28 DIAGNOSIS — J3081 Allergic rhinitis due to animal (cat) (dog) hair and dander: Secondary | ICD-10-CM | POA: Diagnosis not present

## 2022-11-28 DIAGNOSIS — J3089 Other allergic rhinitis: Secondary | ICD-10-CM | POA: Diagnosis not present

## 2022-11-28 DIAGNOSIS — J301 Allergic rhinitis due to pollen: Secondary | ICD-10-CM | POA: Diagnosis not present

## 2022-12-05 DIAGNOSIS — J301 Allergic rhinitis due to pollen: Secondary | ICD-10-CM | POA: Diagnosis not present

## 2022-12-05 DIAGNOSIS — J3081 Allergic rhinitis due to animal (cat) (dog) hair and dander: Secondary | ICD-10-CM | POA: Diagnosis not present

## 2022-12-05 DIAGNOSIS — J3089 Other allergic rhinitis: Secondary | ICD-10-CM | POA: Diagnosis not present

## 2022-12-06 DIAGNOSIS — I129 Hypertensive chronic kidney disease with stage 1 through stage 4 chronic kidney disease, or unspecified chronic kidney disease: Secondary | ICD-10-CM | POA: Diagnosis not present

## 2022-12-06 DIAGNOSIS — M8580 Other specified disorders of bone density and structure, unspecified site: Secondary | ICD-10-CM | POA: Diagnosis not present

## 2022-12-06 DIAGNOSIS — Z91038 Other insect allergy status: Secondary | ICD-10-CM | POA: Diagnosis not present

## 2022-12-06 DIAGNOSIS — Z23 Encounter for immunization: Secondary | ICD-10-CM | POA: Diagnosis not present

## 2022-12-06 DIAGNOSIS — N1831 Chronic kidney disease, stage 3a: Secondary | ICD-10-CM | POA: Diagnosis not present

## 2022-12-06 DIAGNOSIS — E039 Hypothyroidism, unspecified: Secondary | ICD-10-CM | POA: Diagnosis not present

## 2022-12-06 DIAGNOSIS — K219 Gastro-esophageal reflux disease without esophagitis: Secondary | ICD-10-CM | POA: Diagnosis not present

## 2022-12-06 DIAGNOSIS — I700 Atherosclerosis of aorta: Secondary | ICD-10-CM | POA: Diagnosis not present

## 2022-12-06 DIAGNOSIS — N390 Urinary tract infection, site not specified: Secondary | ICD-10-CM | POA: Diagnosis not present

## 2022-12-06 DIAGNOSIS — Z1331 Encounter for screening for depression: Secondary | ICD-10-CM | POA: Diagnosis not present

## 2022-12-06 DIAGNOSIS — E7800 Pure hypercholesterolemia, unspecified: Secondary | ICD-10-CM | POA: Diagnosis not present

## 2022-12-06 DIAGNOSIS — Z0000 Encounter for general adult medical examination without abnormal findings: Secondary | ICD-10-CM | POA: Diagnosis not present

## 2022-12-09 DIAGNOSIS — H52203 Unspecified astigmatism, bilateral: Secondary | ICD-10-CM | POA: Diagnosis not present

## 2022-12-09 DIAGNOSIS — H43813 Vitreous degeneration, bilateral: Secondary | ICD-10-CM | POA: Diagnosis not present

## 2022-12-09 DIAGNOSIS — H0100B Unspecified blepharitis left eye, upper and lower eyelids: Secondary | ICD-10-CM | POA: Diagnosis not present

## 2022-12-09 DIAGNOSIS — H524 Presbyopia: Secondary | ICD-10-CM | POA: Diagnosis not present

## 2022-12-09 DIAGNOSIS — H0100A Unspecified blepharitis right eye, upper and lower eyelids: Secondary | ICD-10-CM | POA: Diagnosis not present

## 2022-12-13 DIAGNOSIS — J3081 Allergic rhinitis due to animal (cat) (dog) hair and dander: Secondary | ICD-10-CM | POA: Diagnosis not present

## 2022-12-13 DIAGNOSIS — J301 Allergic rhinitis due to pollen: Secondary | ICD-10-CM | POA: Diagnosis not present

## 2022-12-13 DIAGNOSIS — J3089 Other allergic rhinitis: Secondary | ICD-10-CM | POA: Diagnosis not present

## 2022-12-19 DIAGNOSIS — J3081 Allergic rhinitis due to animal (cat) (dog) hair and dander: Secondary | ICD-10-CM | POA: Diagnosis not present

## 2022-12-19 DIAGNOSIS — J3089 Other allergic rhinitis: Secondary | ICD-10-CM | POA: Diagnosis not present

## 2022-12-19 DIAGNOSIS — J301 Allergic rhinitis due to pollen: Secondary | ICD-10-CM | POA: Diagnosis not present

## 2022-12-21 DIAGNOSIS — M859 Disorder of bone density and structure, unspecified: Secondary | ICD-10-CM | POA: Diagnosis not present

## 2022-12-21 DIAGNOSIS — Z90710 Acquired absence of both cervix and uterus: Secondary | ICD-10-CM | POA: Diagnosis not present

## 2022-12-21 DIAGNOSIS — Z85038 Personal history of other malignant neoplasm of large intestine: Secondary | ICD-10-CM | POA: Diagnosis not present

## 2022-12-21 DIAGNOSIS — M8580 Other specified disorders of bone density and structure, unspecified site: Secondary | ICD-10-CM | POA: Diagnosis not present

## 2022-12-21 DIAGNOSIS — T63451D Toxic effect of venom of hornets, accidental (unintentional), subsequent encounter: Secondary | ICD-10-CM | POA: Diagnosis not present

## 2022-12-21 DIAGNOSIS — T63461D Toxic effect of venom of wasps, accidental (unintentional), subsequent encounter: Secondary | ICD-10-CM | POA: Diagnosis not present

## 2022-12-26 DIAGNOSIS — J301 Allergic rhinitis due to pollen: Secondary | ICD-10-CM | POA: Diagnosis not present

## 2022-12-26 DIAGNOSIS — J3089 Other allergic rhinitis: Secondary | ICD-10-CM | POA: Diagnosis not present

## 2022-12-26 DIAGNOSIS — J3081 Allergic rhinitis due to animal (cat) (dog) hair and dander: Secondary | ICD-10-CM | POA: Diagnosis not present

## 2022-12-28 DIAGNOSIS — T63461D Toxic effect of venom of wasps, accidental (unintentional), subsequent encounter: Secondary | ICD-10-CM | POA: Diagnosis not present

## 2023-01-02 DIAGNOSIS — J3089 Other allergic rhinitis: Secondary | ICD-10-CM | POA: Diagnosis not present

## 2023-01-02 DIAGNOSIS — J301 Allergic rhinitis due to pollen: Secondary | ICD-10-CM | POA: Diagnosis not present

## 2023-01-02 DIAGNOSIS — J3081 Allergic rhinitis due to animal (cat) (dog) hair and dander: Secondary | ICD-10-CM | POA: Diagnosis not present

## 2023-01-09 DIAGNOSIS — J3089 Other allergic rhinitis: Secondary | ICD-10-CM | POA: Diagnosis not present

## 2023-01-09 DIAGNOSIS — J3081 Allergic rhinitis due to animal (cat) (dog) hair and dander: Secondary | ICD-10-CM | POA: Diagnosis not present

## 2023-01-09 DIAGNOSIS — J301 Allergic rhinitis due to pollen: Secondary | ICD-10-CM | POA: Diagnosis not present

## 2023-01-16 DIAGNOSIS — J301 Allergic rhinitis due to pollen: Secondary | ICD-10-CM | POA: Diagnosis not present

## 2023-01-16 DIAGNOSIS — J3081 Allergic rhinitis due to animal (cat) (dog) hair and dander: Secondary | ICD-10-CM | POA: Diagnosis not present

## 2023-01-16 DIAGNOSIS — J3089 Other allergic rhinitis: Secondary | ICD-10-CM | POA: Diagnosis not present

## 2023-02-01 DIAGNOSIS — R32 Unspecified urinary incontinence: Secondary | ICD-10-CM | POA: Diagnosis not present

## 2023-02-01 DIAGNOSIS — E785 Hyperlipidemia, unspecified: Secondary | ICD-10-CM | POA: Diagnosis not present

## 2023-02-01 DIAGNOSIS — E669 Obesity, unspecified: Secondary | ICD-10-CM | POA: Diagnosis not present

## 2023-02-01 DIAGNOSIS — E039 Hypothyroidism, unspecified: Secondary | ICD-10-CM | POA: Diagnosis not present

## 2023-02-01 DIAGNOSIS — Z8249 Family history of ischemic heart disease and other diseases of the circulatory system: Secondary | ICD-10-CM | POA: Diagnosis not present

## 2023-02-01 DIAGNOSIS — N189 Chronic kidney disease, unspecified: Secondary | ICD-10-CM | POA: Diagnosis not present

## 2023-02-01 DIAGNOSIS — I700 Atherosclerosis of aorta: Secondary | ICD-10-CM | POA: Diagnosis not present

## 2023-02-01 DIAGNOSIS — K219 Gastro-esophageal reflux disease without esophagitis: Secondary | ICD-10-CM | POA: Diagnosis not present

## 2023-02-01 DIAGNOSIS — I129 Hypertensive chronic kidney disease with stage 1 through stage 4 chronic kidney disease, or unspecified chronic kidney disease: Secondary | ICD-10-CM | POA: Diagnosis not present

## 2023-02-01 DIAGNOSIS — Z008 Encounter for other general examination: Secondary | ICD-10-CM | POA: Diagnosis not present

## 2023-02-01 DIAGNOSIS — J309 Allergic rhinitis, unspecified: Secondary | ICD-10-CM | POA: Diagnosis not present

## 2023-02-03 DIAGNOSIS — T63441D Toxic effect of venom of bees, accidental (unintentional), subsequent encounter: Secondary | ICD-10-CM | POA: Diagnosis not present

## 2023-02-03 DIAGNOSIS — T63461D Toxic effect of venom of wasps, accidental (unintentional), subsequent encounter: Secondary | ICD-10-CM | POA: Diagnosis not present

## 2023-02-03 DIAGNOSIS — T63451D Toxic effect of venom of hornets, accidental (unintentional), subsequent encounter: Secondary | ICD-10-CM | POA: Diagnosis not present

## 2023-02-10 DIAGNOSIS — J3089 Other allergic rhinitis: Secondary | ICD-10-CM | POA: Diagnosis not present

## 2023-02-10 DIAGNOSIS — J3081 Allergic rhinitis due to animal (cat) (dog) hair and dander: Secondary | ICD-10-CM | POA: Diagnosis not present

## 2023-02-10 DIAGNOSIS — T63441D Toxic effect of venom of bees, accidental (unintentional), subsequent encounter: Secondary | ICD-10-CM | POA: Diagnosis not present

## 2023-02-10 DIAGNOSIS — J301 Allergic rhinitis due to pollen: Secondary | ICD-10-CM | POA: Diagnosis not present

## 2023-02-14 DIAGNOSIS — L820 Inflamed seborrheic keratosis: Secondary | ICD-10-CM | POA: Diagnosis not present

## 2023-02-15 DIAGNOSIS — J301 Allergic rhinitis due to pollen: Secondary | ICD-10-CM | POA: Diagnosis not present

## 2023-02-15 DIAGNOSIS — J3089 Other allergic rhinitis: Secondary | ICD-10-CM | POA: Diagnosis not present

## 2023-02-15 DIAGNOSIS — J3081 Allergic rhinitis due to animal (cat) (dog) hair and dander: Secondary | ICD-10-CM | POA: Diagnosis not present

## 2023-03-06 DIAGNOSIS — J3089 Other allergic rhinitis: Secondary | ICD-10-CM | POA: Diagnosis not present

## 2023-03-06 DIAGNOSIS — Z91030 Bee allergy status: Secondary | ICD-10-CM | POA: Diagnosis not present

## 2023-03-06 DIAGNOSIS — J4520 Mild intermittent asthma, uncomplicated: Secondary | ICD-10-CM | POA: Diagnosis not present

## 2023-03-06 DIAGNOSIS — T63441D Toxic effect of venom of bees, accidental (unintentional), subsequent encounter: Secondary | ICD-10-CM | POA: Diagnosis not present

## 2023-03-14 DIAGNOSIS — T63441D Toxic effect of venom of bees, accidental (unintentional), subsequent encounter: Secondary | ICD-10-CM | POA: Diagnosis not present

## 2023-03-14 DIAGNOSIS — T63451D Toxic effect of venom of hornets, accidental (unintentional), subsequent encounter: Secondary | ICD-10-CM | POA: Diagnosis not present

## 2023-03-17 DIAGNOSIS — J301 Allergic rhinitis due to pollen: Secondary | ICD-10-CM | POA: Diagnosis not present

## 2023-03-17 DIAGNOSIS — J3089 Other allergic rhinitis: Secondary | ICD-10-CM | POA: Diagnosis not present

## 2023-03-17 DIAGNOSIS — J3081 Allergic rhinitis due to animal (cat) (dog) hair and dander: Secondary | ICD-10-CM | POA: Diagnosis not present

## 2023-04-07 DIAGNOSIS — J301 Allergic rhinitis due to pollen: Secondary | ICD-10-CM | POA: Diagnosis not present

## 2023-04-07 DIAGNOSIS — J3089 Other allergic rhinitis: Secondary | ICD-10-CM | POA: Diagnosis not present

## 2023-04-13 DIAGNOSIS — J3089 Other allergic rhinitis: Secondary | ICD-10-CM | POA: Diagnosis not present

## 2023-04-13 DIAGNOSIS — T63461D Toxic effect of venom of wasps, accidental (unintentional), subsequent encounter: Secondary | ICD-10-CM | POA: Diagnosis not present

## 2023-04-13 DIAGNOSIS — J3081 Allergic rhinitis due to animal (cat) (dog) hair and dander: Secondary | ICD-10-CM | POA: Diagnosis not present

## 2023-04-13 DIAGNOSIS — T63451D Toxic effect of venom of hornets, accidental (unintentional), subsequent encounter: Secondary | ICD-10-CM | POA: Diagnosis not present

## 2023-04-13 DIAGNOSIS — J301 Allergic rhinitis due to pollen: Secondary | ICD-10-CM | POA: Diagnosis not present

## 2023-04-13 DIAGNOSIS — T63441D Toxic effect of venom of bees, accidental (unintentional), subsequent encounter: Secondary | ICD-10-CM | POA: Diagnosis not present

## 2023-05-04 DIAGNOSIS — J3089 Other allergic rhinitis: Secondary | ICD-10-CM | POA: Diagnosis not present

## 2023-05-04 DIAGNOSIS — J4520 Mild intermittent asthma, uncomplicated: Secondary | ICD-10-CM | POA: Diagnosis not present

## 2023-05-04 DIAGNOSIS — R0602 Shortness of breath: Secondary | ICD-10-CM | POA: Diagnosis not present

## 2023-05-04 DIAGNOSIS — H1045 Other chronic allergic conjunctivitis: Secondary | ICD-10-CM | POA: Diagnosis not present

## 2023-05-15 DIAGNOSIS — J3081 Allergic rhinitis due to animal (cat) (dog) hair and dander: Secondary | ICD-10-CM | POA: Diagnosis not present

## 2023-05-15 DIAGNOSIS — T63441D Toxic effect of venom of bees, accidental (unintentional), subsequent encounter: Secondary | ICD-10-CM | POA: Diagnosis not present

## 2023-05-15 DIAGNOSIS — J301 Allergic rhinitis due to pollen: Secondary | ICD-10-CM | POA: Diagnosis not present

## 2023-05-15 DIAGNOSIS — J3089 Other allergic rhinitis: Secondary | ICD-10-CM | POA: Diagnosis not present

## 2023-05-22 DIAGNOSIS — J3089 Other allergic rhinitis: Secondary | ICD-10-CM | POA: Diagnosis not present

## 2023-05-22 DIAGNOSIS — J301 Allergic rhinitis due to pollen: Secondary | ICD-10-CM | POA: Diagnosis not present

## 2023-05-22 DIAGNOSIS — J3081 Allergic rhinitis due to animal (cat) (dog) hair and dander: Secondary | ICD-10-CM | POA: Diagnosis not present

## 2023-05-29 DIAGNOSIS — J301 Allergic rhinitis due to pollen: Secondary | ICD-10-CM | POA: Diagnosis not present

## 2023-05-29 DIAGNOSIS — J3081 Allergic rhinitis due to animal (cat) (dog) hair and dander: Secondary | ICD-10-CM | POA: Diagnosis not present

## 2023-05-29 DIAGNOSIS — J3089 Other allergic rhinitis: Secondary | ICD-10-CM | POA: Diagnosis not present

## 2023-06-05 DIAGNOSIS — J301 Allergic rhinitis due to pollen: Secondary | ICD-10-CM | POA: Diagnosis not present

## 2023-06-05 DIAGNOSIS — J3081 Allergic rhinitis due to animal (cat) (dog) hair and dander: Secondary | ICD-10-CM | POA: Diagnosis not present

## 2023-06-05 DIAGNOSIS — J3089 Other allergic rhinitis: Secondary | ICD-10-CM | POA: Diagnosis not present

## 2023-06-12 DIAGNOSIS — J301 Allergic rhinitis due to pollen: Secondary | ICD-10-CM | POA: Diagnosis not present

## 2023-06-12 DIAGNOSIS — T63441D Toxic effect of venom of bees, accidental (unintentional), subsequent encounter: Secondary | ICD-10-CM | POA: Diagnosis not present

## 2023-06-12 DIAGNOSIS — J3089 Other allergic rhinitis: Secondary | ICD-10-CM | POA: Diagnosis not present

## 2023-06-12 DIAGNOSIS — I129 Hypertensive chronic kidney disease with stage 1 through stage 4 chronic kidney disease, or unspecified chronic kidney disease: Secondary | ICD-10-CM | POA: Diagnosis not present

## 2023-06-12 DIAGNOSIS — N1831 Chronic kidney disease, stage 3a: Secondary | ICD-10-CM | POA: Diagnosis not present

## 2023-06-12 DIAGNOSIS — J3081 Allergic rhinitis due to animal (cat) (dog) hair and dander: Secondary | ICD-10-CM | POA: Diagnosis not present

## 2023-06-12 DIAGNOSIS — E782 Mixed hyperlipidemia: Secondary | ICD-10-CM | POA: Diagnosis not present

## 2023-06-19 DIAGNOSIS — J3081 Allergic rhinitis due to animal (cat) (dog) hair and dander: Secondary | ICD-10-CM | POA: Diagnosis not present

## 2023-06-19 DIAGNOSIS — J301 Allergic rhinitis due to pollen: Secondary | ICD-10-CM | POA: Diagnosis not present

## 2023-06-19 DIAGNOSIS — Z1231 Encounter for screening mammogram for malignant neoplasm of breast: Secondary | ICD-10-CM | POA: Diagnosis not present

## 2023-06-19 DIAGNOSIS — J3089 Other allergic rhinitis: Secondary | ICD-10-CM | POA: Diagnosis not present

## 2023-06-26 DIAGNOSIS — J301 Allergic rhinitis due to pollen: Secondary | ICD-10-CM | POA: Diagnosis not present

## 2023-06-26 DIAGNOSIS — J3089 Other allergic rhinitis: Secondary | ICD-10-CM | POA: Diagnosis not present

## 2023-06-26 DIAGNOSIS — J3081 Allergic rhinitis due to animal (cat) (dog) hair and dander: Secondary | ICD-10-CM | POA: Diagnosis not present

## 2023-07-03 DIAGNOSIS — J301 Allergic rhinitis due to pollen: Secondary | ICD-10-CM | POA: Diagnosis not present

## 2023-07-03 DIAGNOSIS — J3089 Other allergic rhinitis: Secondary | ICD-10-CM | POA: Diagnosis not present

## 2023-07-03 DIAGNOSIS — J3081 Allergic rhinitis due to animal (cat) (dog) hair and dander: Secondary | ICD-10-CM | POA: Diagnosis not present

## 2023-07-10 DIAGNOSIS — J3081 Allergic rhinitis due to animal (cat) (dog) hair and dander: Secondary | ICD-10-CM | POA: Diagnosis not present

## 2023-07-10 DIAGNOSIS — T63451D Toxic effect of venom of hornets, accidental (unintentional), subsequent encounter: Secondary | ICD-10-CM | POA: Diagnosis not present

## 2023-07-10 DIAGNOSIS — T63461D Toxic effect of venom of wasps, accidental (unintentional), subsequent encounter: Secondary | ICD-10-CM | POA: Diagnosis not present

## 2023-07-10 DIAGNOSIS — T63441D Toxic effect of venom of bees, accidental (unintentional), subsequent encounter: Secondary | ICD-10-CM | POA: Diagnosis not present

## 2023-07-17 DIAGNOSIS — J3081 Allergic rhinitis due to animal (cat) (dog) hair and dander: Secondary | ICD-10-CM | POA: Diagnosis not present

## 2023-07-17 DIAGNOSIS — J3089 Other allergic rhinitis: Secondary | ICD-10-CM | POA: Diagnosis not present

## 2023-07-17 DIAGNOSIS — J301 Allergic rhinitis due to pollen: Secondary | ICD-10-CM | POA: Diagnosis not present

## 2023-08-09 DIAGNOSIS — J3081 Allergic rhinitis due to animal (cat) (dog) hair and dander: Secondary | ICD-10-CM | POA: Diagnosis not present

## 2023-08-09 DIAGNOSIS — J3089 Other allergic rhinitis: Secondary | ICD-10-CM | POA: Diagnosis not present

## 2023-08-09 DIAGNOSIS — J301 Allergic rhinitis due to pollen: Secondary | ICD-10-CM | POA: Diagnosis not present

## 2023-08-15 DIAGNOSIS — J301 Allergic rhinitis due to pollen: Secondary | ICD-10-CM | POA: Diagnosis not present

## 2023-08-15 DIAGNOSIS — J3081 Allergic rhinitis due to animal (cat) (dog) hair and dander: Secondary | ICD-10-CM | POA: Diagnosis not present

## 2023-08-15 DIAGNOSIS — J3089 Other allergic rhinitis: Secondary | ICD-10-CM | POA: Diagnosis not present

## 2023-09-04 DIAGNOSIS — J3081 Allergic rhinitis due to animal (cat) (dog) hair and dander: Secondary | ICD-10-CM | POA: Diagnosis not present

## 2023-09-04 DIAGNOSIS — J301 Allergic rhinitis due to pollen: Secondary | ICD-10-CM | POA: Diagnosis not present

## 2023-09-04 DIAGNOSIS — J3089 Other allergic rhinitis: Secondary | ICD-10-CM | POA: Diagnosis not present

## 2023-09-11 DIAGNOSIS — T63421D Toxic effect of venom of ants, accidental (unintentional), subsequent encounter: Secondary | ICD-10-CM | POA: Diagnosis not present

## 2023-09-11 DIAGNOSIS — T63451D Toxic effect of venom of hornets, accidental (unintentional), subsequent encounter: Secondary | ICD-10-CM | POA: Diagnosis not present

## 2023-09-11 DIAGNOSIS — T63441D Toxic effect of venom of bees, accidental (unintentional), subsequent encounter: Secondary | ICD-10-CM | POA: Diagnosis not present

## 2023-09-13 DIAGNOSIS — Z7983 Long term (current) use of bisphosphonates: Secondary | ICD-10-CM | POA: Diagnosis not present

## 2023-09-13 DIAGNOSIS — R32 Unspecified urinary incontinence: Secondary | ICD-10-CM | POA: Diagnosis not present

## 2023-09-13 DIAGNOSIS — Z008 Encounter for other general examination: Secondary | ICD-10-CM | POA: Diagnosis not present

## 2023-09-13 DIAGNOSIS — Z809 Family history of malignant neoplasm, unspecified: Secondary | ICD-10-CM | POA: Diagnosis not present

## 2023-09-13 DIAGNOSIS — N189 Chronic kidney disease, unspecified: Secondary | ICD-10-CM | POA: Diagnosis not present

## 2023-09-13 DIAGNOSIS — K219 Gastro-esophageal reflux disease without esophagitis: Secondary | ICD-10-CM | POA: Diagnosis not present

## 2023-09-13 DIAGNOSIS — Z8249 Family history of ischemic heart disease and other diseases of the circulatory system: Secondary | ICD-10-CM | POA: Diagnosis not present

## 2023-09-13 DIAGNOSIS — Z833 Family history of diabetes mellitus: Secondary | ICD-10-CM | POA: Diagnosis not present

## 2023-09-13 DIAGNOSIS — E785 Hyperlipidemia, unspecified: Secondary | ICD-10-CM | POA: Diagnosis not present

## 2023-09-13 DIAGNOSIS — J45909 Unspecified asthma, uncomplicated: Secondary | ICD-10-CM | POA: Diagnosis not present

## 2023-09-13 DIAGNOSIS — Z79890 Hormone replacement therapy (postmenopausal): Secondary | ICD-10-CM | POA: Diagnosis not present

## 2023-09-13 DIAGNOSIS — E039 Hypothyroidism, unspecified: Secondary | ICD-10-CM | POA: Diagnosis not present

## 2023-09-13 DIAGNOSIS — I129 Hypertensive chronic kidney disease with stage 1 through stage 4 chronic kidney disease, or unspecified chronic kidney disease: Secondary | ICD-10-CM | POA: Diagnosis not present

## 2023-09-20 DIAGNOSIS — R300 Dysuria: Secondary | ICD-10-CM | POA: Diagnosis not present

## 2023-09-20 DIAGNOSIS — N8111 Cystocele, midline: Secondary | ICD-10-CM | POA: Diagnosis not present

## 2023-09-20 DIAGNOSIS — N1831 Chronic kidney disease, stage 3a: Secondary | ICD-10-CM | POA: Diagnosis not present

## 2023-09-20 DIAGNOSIS — I129 Hypertensive chronic kidney disease with stage 1 through stage 4 chronic kidney disease, or unspecified chronic kidney disease: Secondary | ICD-10-CM | POA: Diagnosis not present

## 2023-09-25 DIAGNOSIS — J301 Allergic rhinitis due to pollen: Secondary | ICD-10-CM | POA: Diagnosis not present

## 2023-09-25 DIAGNOSIS — J3089 Other allergic rhinitis: Secondary | ICD-10-CM | POA: Diagnosis not present

## 2023-09-25 DIAGNOSIS — J3081 Allergic rhinitis due to animal (cat) (dog) hair and dander: Secondary | ICD-10-CM | POA: Diagnosis not present

## 2023-09-26 DIAGNOSIS — N8110 Cystocele, unspecified: Secondary | ICD-10-CM | POA: Diagnosis not present

## 2023-09-26 DIAGNOSIS — Z4689 Encounter for fitting and adjustment of other specified devices: Secondary | ICD-10-CM | POA: Diagnosis not present

## 2023-09-26 DIAGNOSIS — N958 Other specified menopausal and perimenopausal disorders: Secondary | ICD-10-CM | POA: Diagnosis not present

## 2023-10-05 DIAGNOSIS — Z4689 Encounter for fitting and adjustment of other specified devices: Secondary | ICD-10-CM | POA: Diagnosis not present

## 2023-10-11 DIAGNOSIS — T63451D Toxic effect of venom of hornets, accidental (unintentional), subsequent encounter: Secondary | ICD-10-CM | POA: Diagnosis not present

## 2023-10-11 DIAGNOSIS — J3089 Other allergic rhinitis: Secondary | ICD-10-CM | POA: Diagnosis not present

## 2023-10-11 DIAGNOSIS — J301 Allergic rhinitis due to pollen: Secondary | ICD-10-CM | POA: Diagnosis not present

## 2023-10-11 DIAGNOSIS — T63461D Toxic effect of venom of wasps, accidental (unintentional), subsequent encounter: Secondary | ICD-10-CM | POA: Diagnosis not present

## 2023-10-30 DIAGNOSIS — J3081 Allergic rhinitis due to animal (cat) (dog) hair and dander: Secondary | ICD-10-CM | POA: Diagnosis not present

## 2023-10-30 DIAGNOSIS — J301 Allergic rhinitis due to pollen: Secondary | ICD-10-CM | POA: Diagnosis not present

## 2023-10-30 DIAGNOSIS — J3089 Other allergic rhinitis: Secondary | ICD-10-CM | POA: Diagnosis not present

## 2023-10-31 DIAGNOSIS — Z4689 Encounter for fitting and adjustment of other specified devices: Secondary | ICD-10-CM | POA: Diagnosis not present

## 2023-10-31 DIAGNOSIS — N9489 Other specified conditions associated with female genital organs and menstrual cycle: Secondary | ICD-10-CM | POA: Diagnosis not present

## 2023-10-31 DIAGNOSIS — N958 Other specified menopausal and perimenopausal disorders: Secondary | ICD-10-CM | POA: Diagnosis not present

## 2023-10-31 DIAGNOSIS — N8110 Cystocele, unspecified: Secondary | ICD-10-CM | POA: Diagnosis not present

## 2023-10-31 DIAGNOSIS — R350 Frequency of micturition: Secondary | ICD-10-CM | POA: Diagnosis not present

## 2023-11-06 DIAGNOSIS — R350 Frequency of micturition: Secondary | ICD-10-CM | POA: Diagnosis not present

## 2023-11-06 DIAGNOSIS — N8110 Cystocele, unspecified: Secondary | ICD-10-CM | POA: Diagnosis not present

## 2023-11-06 DIAGNOSIS — N9489 Other specified conditions associated with female genital organs and menstrual cycle: Secondary | ICD-10-CM | POA: Diagnosis not present

## 2023-11-06 DIAGNOSIS — N958 Other specified menopausal and perimenopausal disorders: Secondary | ICD-10-CM | POA: Diagnosis not present

## 2023-11-06 DIAGNOSIS — R030 Elevated blood-pressure reading, without diagnosis of hypertension: Secondary | ICD-10-CM | POA: Diagnosis not present

## 2023-11-06 DIAGNOSIS — Z4689 Encounter for fitting and adjustment of other specified devices: Secondary | ICD-10-CM | POA: Diagnosis not present

## 2023-11-08 DIAGNOSIS — T63451D Toxic effect of venom of hornets, accidental (unintentional), subsequent encounter: Secondary | ICD-10-CM | POA: Diagnosis not present

## 2023-11-08 DIAGNOSIS — T63461D Toxic effect of venom of wasps, accidental (unintentional), subsequent encounter: Secondary | ICD-10-CM | POA: Diagnosis not present

## 2023-11-08 DIAGNOSIS — T63441D Toxic effect of venom of bees, accidental (unintentional), subsequent encounter: Secondary | ICD-10-CM | POA: Diagnosis not present

## 2023-11-17 DIAGNOSIS — J3081 Allergic rhinitis due to animal (cat) (dog) hair and dander: Secondary | ICD-10-CM | POA: Diagnosis not present

## 2023-11-17 DIAGNOSIS — J301 Allergic rhinitis due to pollen: Secondary | ICD-10-CM | POA: Diagnosis not present

## 2023-11-17 DIAGNOSIS — J3089 Other allergic rhinitis: Secondary | ICD-10-CM | POA: Diagnosis not present

## 2023-11-21 DIAGNOSIS — H01004 Unspecified blepharitis left upper eyelid: Secondary | ICD-10-CM | POA: Diagnosis not present

## 2023-11-21 DIAGNOSIS — H43813 Vitreous degeneration, bilateral: Secondary | ICD-10-CM | POA: Diagnosis not present

## 2023-11-21 DIAGNOSIS — Z961 Presence of intraocular lens: Secondary | ICD-10-CM | POA: Diagnosis not present

## 2023-11-21 DIAGNOSIS — H0100A Unspecified blepharitis right eye, upper and lower eyelids: Secondary | ICD-10-CM | POA: Diagnosis not present

## 2023-11-21 DIAGNOSIS — H04123 Dry eye syndrome of bilateral lacrimal glands: Secondary | ICD-10-CM | POA: Diagnosis not present

## 2023-12-04 DIAGNOSIS — J3089 Other allergic rhinitis: Secondary | ICD-10-CM | POA: Diagnosis not present

## 2023-12-04 DIAGNOSIS — T63441D Toxic effect of venom of bees, accidental (unintentional), subsequent encounter: Secondary | ICD-10-CM | POA: Diagnosis not present

## 2023-12-04 DIAGNOSIS — J3081 Allergic rhinitis due to animal (cat) (dog) hair and dander: Secondary | ICD-10-CM | POA: Diagnosis not present

## 2023-12-04 DIAGNOSIS — J301 Allergic rhinitis due to pollen: Secondary | ICD-10-CM | POA: Diagnosis not present

## 2023-12-30 ENCOUNTER — Other Ambulatory Visit: Payer: Self-pay

## 2023-12-30 ENCOUNTER — Emergency Department (HOSPITAL_COMMUNITY)

## 2023-12-30 ENCOUNTER — Observation Stay (HOSPITAL_COMMUNITY)
Admission: EM | Admit: 2023-12-30 | Discharge: 2024-01-01 | Disposition: A | Discharge status: Home / Self Care | Attending: Internal Medicine | Admitting: Internal Medicine

## 2023-12-30 ENCOUNTER — Encounter (HOSPITAL_COMMUNITY): Payer: Self-pay | Admitting: *Deleted

## 2023-12-30 DIAGNOSIS — K219 Gastro-esophageal reflux disease without esophagitis: Secondary | ICD-10-CM | POA: Insufficient documentation

## 2023-12-30 DIAGNOSIS — K625 Hemorrhage of anus and rectum: Secondary | ICD-10-CM | POA: Diagnosis not present

## 2023-12-30 DIAGNOSIS — E871 Hypo-osmolality and hyponatremia: Secondary | ICD-10-CM | POA: Insufficient documentation

## 2023-12-30 DIAGNOSIS — K529 Noninfective gastroenteritis and colitis, unspecified: Secondary | ICD-10-CM | POA: Diagnosis not present

## 2023-12-30 DIAGNOSIS — Z85038 Personal history of other malignant neoplasm of large intestine: Secondary | ICD-10-CM | POA: Insufficient documentation

## 2023-12-30 DIAGNOSIS — E860 Dehydration: Secondary | ICD-10-CM | POA: Insufficient documentation

## 2023-12-30 DIAGNOSIS — K449 Diaphragmatic hernia without obstruction or gangrene: Secondary | ICD-10-CM | POA: Diagnosis not present

## 2023-12-30 DIAGNOSIS — I10 Essential (primary) hypertension: Secondary | ICD-10-CM | POA: Insufficient documentation

## 2023-12-30 DIAGNOSIS — R1030 Lower abdominal pain, unspecified: Secondary | ICD-10-CM | POA: Diagnosis not present

## 2023-12-30 DIAGNOSIS — E039 Hypothyroidism, unspecified: Secondary | ICD-10-CM | POA: Insufficient documentation

## 2023-12-30 DIAGNOSIS — K922 Gastrointestinal hemorrhage, unspecified: Secondary | ICD-10-CM | POA: Diagnosis present

## 2023-12-30 DIAGNOSIS — R2689 Other abnormalities of gait and mobility: Secondary | ICD-10-CM | POA: Insufficient documentation

## 2023-12-30 DIAGNOSIS — Z862 Personal history of diseases of the blood and blood-forming organs and certain disorders involving the immune mechanism: Secondary | ICD-10-CM | POA: Insufficient documentation

## 2023-12-30 DIAGNOSIS — Z7982 Long term (current) use of aspirin: Secondary | ICD-10-CM | POA: Insufficient documentation

## 2023-12-30 DIAGNOSIS — R0602 Shortness of breath: Secondary | ICD-10-CM | POA: Insufficient documentation

## 2023-12-30 DIAGNOSIS — N281 Cyst of kidney, acquired: Secondary | ICD-10-CM | POA: Diagnosis not present

## 2023-12-30 DIAGNOSIS — K7689 Other specified diseases of liver: Secondary | ICD-10-CM | POA: Diagnosis not present

## 2023-12-30 LAB — CBC WITH DIFFERENTIAL/PLATELET
Abs Immature Granulocytes: 0.07 10*3/uL (ref 0.00–0.07)
Abs Immature Granulocytes: 0.06 10*3/uL (ref 0.00–0.07)
Basophils Absolute: 0.1 10*3/uL (ref 0.0–0.1)
Basophils Absolute: 0.1 10*3/uL (ref 0.0–0.1)
Basophils Relative: 0 %
Basophils Relative: 0 %
Eosinophils Absolute: 0 10*3/uL (ref 0.0–0.5)
Eosinophils Absolute: 0.1 10*3/uL (ref 0.0–0.5)
Eosinophils Relative: 0 %
Eosinophils Relative: 0 %
HCT: 41.6 % (ref 36.0–46.0)
HCT: 37.6 % (ref 36.0–46.0)
Hemoglobin: 14.1 g/dL (ref 12.0–15.0)
Hemoglobin: 12.7 g/dL (ref 12.0–15.0)
Immature Granulocytes: 0 %
Immature Granulocytes: 0 %
Lymphocytes Relative: 14 %
Lymphocytes Relative: 10 %
Lymphs Abs: 2.2 10*3/uL (ref 0.7–4.0)
Lymphs Abs: 1.5 10*3/uL (ref 0.7–4.0)
MCH: 30.9 pg (ref 26.0–34.0)
MCH: 31.3 pg (ref 26.0–34.0)
MCHC: 33.9 g/dL (ref 30.0–36.0)
MCHC: 33.8 g/dL (ref 30.0–36.0)
MCV: 91.2 fL (ref 80.0–100.0)
MCV: 92.6 fL (ref 80.0–100.0)
Monocytes Absolute: 1.5 10*3/uL — ABNORMAL HIGH (ref 0.1–1.0)
Monocytes Absolute: 1.7 10*3/uL — ABNORMAL HIGH (ref 0.1–1.0)
Monocytes Relative: 9 %
Monocytes Relative: 12 %
Neutro Abs: 12 10*3/uL — ABNORMAL HIGH (ref 1.7–7.7)
Neutro Abs: 11.2 10*3/uL — ABNORMAL HIGH (ref 1.7–7.7)
Neutrophils Relative %: 77 %
Neutrophils Relative %: 78 %
Platelets: 320 10*3/uL (ref 150–400)
Platelets: 280 10*3/uL (ref 150–400)
RBC: 4.56 MIL/uL (ref 3.87–5.11)
RBC: 4.06 MIL/uL (ref 3.87–5.11)
RDW: 13.9 % (ref 11.5–15.5)
RDW: 14.2 % (ref 11.5–15.5)
WBC: 15.8 10*3/uL — ABNORMAL HIGH (ref 4.0–10.5)
WBC: 14.5 10*3/uL — ABNORMAL HIGH (ref 4.0–10.5)
nRBC: 0 % (ref 0.0–0.2)
nRBC: 0 % (ref 0.0–0.2)

## 2023-12-30 LAB — CBC
HCT: 41 % (ref 36.0–46.0)
Hemoglobin: 13.8 g/dL (ref 12.0–15.0)
MCH: 30.9 pg (ref 26.0–34.0)
MCHC: 33.7 g/dL (ref 30.0–36.0)
MCV: 91.9 fL (ref 80.0–100.0)
Platelets: 301 10*3/uL (ref 150–400)
RBC: 4.46 MIL/uL (ref 3.87–5.11)
RDW: 13.8 % (ref 11.5–15.5)
WBC: 14.9 10*3/uL — ABNORMAL HIGH (ref 4.0–10.5)
nRBC: 0 % (ref 0.0–0.2)

## 2023-12-30 LAB — COMPREHENSIVE METABOLIC PANEL WITH GFR
ALT: 13 U/L (ref 0–44)
AST: 21 U/L (ref 15–41)
Albumin: 3.7 g/dL (ref 3.5–5.0)
Alkaline Phosphatase: 59 U/L (ref 38–126)
Anion gap: 12 (ref 5–15)
BUN: 10 mg/dL (ref 8–23)
CO2: 21 mmol/L — ABNORMAL LOW (ref 22–32)
Calcium: 8.7 mg/dL — ABNORMAL LOW (ref 8.9–10.3)
Chloride: 95 mmol/L — ABNORMAL LOW (ref 98–111)
Creatinine, Ser: 0.97 mg/dL (ref 0.44–1.00)
GFR, Estimated: 57 mL/min — ABNORMAL LOW (ref >60)
Glucose, Bld: 138 mg/dL — ABNORMAL HIGH (ref 70–99)
Potassium: 4 mmol/L (ref 3.5–5.1)
Sodium: 128 mmol/L — ABNORMAL LOW (ref 135–145)
Total Bilirubin: 0.9 mg/dL (ref 0.0–1.2)
Total Protein: 6.6 g/dL (ref 6.5–8.1)

## 2023-12-30 LAB — URIC ACID: Uric Acid, Serum: 4.4 mg/dL (ref 2.5–7.1)

## 2023-12-30 LAB — LIPASE, BLOOD: Lipase: 28 U/L (ref 11–51)

## 2023-12-30 LAB — URINALYSIS, ROUTINE W REFLEX MICROSCOPIC
Bilirubin Urine: NEGATIVE
Glucose, UA: NEGATIVE mg/dL
Hgb urine dipstick: NEGATIVE
Ketones, ur: NEGATIVE mg/dL
Leukocytes,Ua: NEGATIVE
Nitrite: NEGATIVE
Protein, ur: NEGATIVE mg/dL
Specific Gravity, Urine: 1.017 (ref 1.005–1.030)
pH: 6 (ref 5.0–8.0)

## 2023-12-30 LAB — TYPE AND SCREEN
ABO/RH(D): O POS
Antibody Screen: NEGATIVE

## 2023-12-30 LAB — OSMOLALITY: Osmolality: 279 mosm/kg (ref 275–295)

## 2023-12-30 MED ORDER — LORATADINE 10 MG PO TABS: 10.0000 mg | ORAL_TABLET | Freq: Every day | ORAL | Status: DC | PRN

## 2023-12-30 MED ORDER — METRONIDAZOLE 500 MG/100ML IV SOLN
500.0000 mg | Freq: Two times a day (BID) | INTRAVENOUS | Status: DC
  Administered 2023-12-31 (×2): 500 mg via INTRAVENOUS
  Filled 2023-12-30 (×3): qty 100

## 2023-12-30 MED ORDER — SODIUM CHLORIDE 0.9 % IV SOLN
2.0000 g | INTRAVENOUS | Status: DC
  Administered 2023-12-31: 2 g via INTRAVENOUS
  Filled 2023-12-30: qty 20

## 2023-12-30 MED ORDER — MAGNESIUM OXIDE -MG SUPPLEMENT 400 (240 MG) MG PO TABS
400.0000 mg | ORAL_TABLET | Freq: Every day | ORAL | Status: DC
  Administered 2023-12-31 – 2024-01-01 (×2): 400 mg via ORAL
  Filled 2023-12-30 (×3): qty 1

## 2023-12-30 MED ORDER — ACETAMINOPHEN 325 MG PO TABS: 650.0000 mg | ORAL_TABLET | Freq: Four times a day (QID) | ORAL | Status: DC | PRN

## 2023-12-30 MED ORDER — PROMETHAZINE HCL 12.5 MG PO TABS: 12.5000 mg | ORAL_TABLET | Freq: Four times a day (QID) | ORAL | Status: DC | PRN

## 2023-12-30 MED ORDER — PANTOPRAZOLE SODIUM 40 MG PO TBEC
40.0000 mg | DELAYED_RELEASE_TABLET | Freq: Two times a day (BID) | ORAL | Status: DC
  Administered 2023-12-31 – 2024-01-01 (×3): 40 mg via ORAL
  Filled 2023-12-30 (×6): qty 1

## 2023-12-30 MED ORDER — AMLODIPINE BESYLATE 10 MG PO TABS
10.0000 mg | ORAL_TABLET | Freq: Every day | ORAL | Status: DC
  Administered 2023-12-31 – 2024-01-01 (×2): 10 mg via ORAL
  Filled 2023-12-30 (×2): qty 1

## 2023-12-30 MED ORDER — LACTATED RINGERS IV SOLN: INTRAVENOUS | Status: AC

## 2023-12-30 MED ORDER — AMLODIPINE BESYLATE 5 MG PO TABS: 5.0000 mg | ORAL_TABLET | Freq: Every day | ORAL | Status: DC

## 2023-12-30 MED ORDER — IOHEXOL 350 MG/ML SOLN: 100.0000 mL | Freq: Once | INTRAVENOUS | Status: AC | PRN

## 2023-12-30 MED ORDER — HYDRALAZINE HCL 20 MG/ML IJ SOLN: 10.0000 mg | Freq: Four times a day (QID) | INTRAMUSCULAR | Status: DC | PRN

## 2023-12-30 MED ORDER — LEVOTHYROXINE SODIUM 50 MCG PO TABS
50.0000 ug | ORAL_TABLET | Freq: Every day | ORAL | Status: DC
  Administered 2023-12-31 – 2024-01-01 (×2): 50 ug via ORAL
  Filled 2023-12-30 (×2): qty 1

## 2023-12-30 MED ORDER — SIMVASTATIN 20 MG PO TABS
40.0000 mg | ORAL_TABLET | Freq: Every day | ORAL | Status: DC
  Administered 2023-12-31: 40 mg via ORAL
  Filled 2023-12-30 (×2): qty 2

## 2023-12-30 MED ORDER — ORAL CARE MOUTH RINSE: 15.0000 mL | OROMUCOSAL | Status: DC | PRN

## 2023-12-30 MED ORDER — VITAMIN B-12 1000 MCG PO TABS
1000.0000 ug | ORAL_TABLET | Freq: Every day | ORAL | Status: DC
  Administered 2023-12-31 – 2024-01-01 (×2): 1000 ug via ORAL
  Filled 2023-12-30 (×3): qty 1

## 2023-12-30 MED ORDER — EPINEPHRINE 0.3 MG/0.3ML IJ SOAJ: 0.3000 mg | INTRAMUSCULAR | Status: DC | PRN

## 2023-12-30 MED ORDER — CEFTRIAXONE SODIUM 2 G IJ SOLR
2.0000 g | Freq: Once | INTRAMUSCULAR | Status: AC
  Filled 2023-12-30: qty 20

## 2023-12-30 MED ORDER — METRONIDAZOLE 500 MG/100ML IV SOLN
500.0000 mg | Freq: Once | INTRAVENOUS | Status: AC
  Filled 2023-12-30: qty 100

## 2023-12-30 MED ORDER — ACETAMINOPHEN 650 MG RE SUPP: 650.0000 mg | Freq: Four times a day (QID) | RECTAL | Status: DC | PRN

## 2023-12-30 NOTE — ED Triage Notes (Signed)
 Patient states she started having sharp lower abd. Pain last pm thought she had gas went to bathroom and it was approx. 1tsp of bright red blood. , states she did that every 15-20 mins all night. States she has a history of colon cancer and had surgery when she was 88 yrs old

## 2023-12-30 NOTE — Consult Note (Signed)
 [facility] Consult  Referring Provider: Er Primary Care Physician:  Sun, Vyvyan, MD Primary Gastroenterologist: Dr. Kimble Pennant  Reason for Consultation: Rectal bleeding, abdominal pain  HPI: Allison Knight is a 88 y.o. female was in her usual state of health until 10 PM last night when she was about to go to bed and experienced lower abdominal pain.  She thought she had buildup of abdominal gas and probably required to have a bowel movement.  However when she went to the bathroom she passed bright red blood without any stool.  Patient had to go to the bathroom and passed bright red blood per rectum every 15-20 minutes, throughout the night, finally called a neighbor in the morning and came to the ER today. Since presentation to the ER, she has not had any further rectal bleeding. She continues to have mild left-sided abdominal discomfort however denies pain and states she is hungry.  She denies fever, chills or rigors. Denies nausea or vomiting. Denies recent travel, sick contact or introduction of new medications.  She is on famotidine and omeprazole for acid reflux with well control of symptoms, denies difficulty swallowing or pain on swallowing. She is widowed, lives by herself. Denies recent unintentional weight loss or loss of appetite.  Since her colon surgery, it is normal for her to have 2-3 bowel movements a day, she rarely gets constipated.   Previous GI workup: EGD 2021, acid reflux, Dr. Kimble Pennant: Medium size hiatal hernia, otherwise unremarkable Colonoscopy 2021, surveillance, Dr. Kimble Pennant: Ischemic ulceration noted in transverse colon?  Prep related, diverticulosis in sigmoid and descending, internal hemorrhoids Colonoscopy, 2016, surveillance, personal history of colon cancer: Tubular adenoma removed from cecum and transverse, colocolonic anastomosis noted in sigmoid, thrombosed external hemorrhoids, left-sided diverticulosis EGD, 2016, chronic cough, acid reflux: 5 cm  hiatal hernia, otherwise unremarkable Colonoscopy, 2011, history of colon cancer requiring anterior resection in 1991, Dr. Grandville Lax Colonoscopy 2006 Colonoscopy 2003: Adenoma removed Colonoscopy 1996   Past Medical History:  Diagnosis Date   Anemia    Colon cancer (HCC)    GERD (gastroesophageal reflux disease)    Hiatal hernia    Hyperlipemia    Hypertension    Syncope    a. 02/2015 Echo: Ef 60-65%, Gr 1 DD;  b. Felt to be dehydrated, HCTZ d/c'd.   Thyroid  disease     Past Surgical History:  Procedure Laterality Date   COLON SURGERY      Prior to Admission medications   Medication Sig Start Date End Date Taking? Authorizing Provider  acetaminophen  (TYLENOL ) 500 MG tablet Take 500 mg by mouth daily as needed (pain).    [provider]  amLODipine  (NORVASC ) 5 MG tablet Take 5 mg by mouth daily. 02/09/15   [provider]  aspirin EC 81 MG tablet Take 81 mg by mouth at bedtime.    [provider]  Calcium  Citrate-Vitamin D (CITRACAL MAXIMUM) 315-250 MG-UNIT TABS Take 1 tablet by mouth daily.    [provider]  Cholecalciferol (VITAMIN D) 2000 UNITS tablet Take 2,000 Units by mouth daily.    [provider]  Coenzyme Q10 100 MG capsule Take 100 mg by mouth daily.    [provider]  EPINEPHrine (EPIPEN 2-PAK) 0.3 mg/0.3 mL IJ SOAJ injection Inject 0.3 mg into the muscle as needed (allergic reactions).    [provider]  levothyroxine  (SYNTHROID , LEVOTHROID) 25 MCG tablet Take 37.5 mcg by mouth daily before breakfast.  02/09/15   [provider]  loratadine (CLARITIN) 10  MG tablet Take 10 mg by mouth daily as needed for allergies.    [provider]  Magnesium 250 MG TABS Take 250 mg by mouth daily.    [provider]  Omega 3-Lutein-Zeaxanthin (ADVANCED EYE HEALTH) 250-2.5-0.5 MG CAPS Take 1 tablet by mouth daily.    [provider]  Omega-3 Fatty Acids (FISH OIL) 1200 MG CAPS Take 1,200  mg by mouth daily.    [provider]  pantoprazole  (PROTONIX ) 40 MG tablet Take 40 mg by mouth 2 (two) times daily. 02/13/15   [provider]  Polyethyl Glycol-Propyl Glycol (SYSTANE OP) Place 1 drop into both eyes 2 (two) times daily.    [provider]  Polysaccharide Iron Complex (FERREX 150 PO) Take 150 mg by mouth daily.    [provider]  raloxifene  (EVISTA ) 60 MG tablet Take 60 mg by mouth daily after lunch.  02/05/15   [provider]  simvastatin  (ZOCOR ) 40 MG tablet Take 40 mg by mouth at bedtime. 02/09/15   [provider]  sodium chloride  (OCEAN) 0.65 % SOLN nasal spray Place 1 spray into both nostrils 2 (two) times daily.    [provider]  valsartan (DIOVAN) 320 MG tablet Take 320 mg by mouth daily. 03/26/15   [provider]  vitamin B-12 (CYANOCOBALAMIN) 1000 MCG tablet Take 1,000 mcg by mouth daily.    [provider]    Current Facility-Administered Medications  Medication Dose Route Frequency Provider Last Rate Last Admin   cefTRIAXone (ROCEPHIN) 2 g in sodium chloride  0.9 % 100 mL IVPB  2 g Intravenous Once Messick, Peter C, MD       And   metroNIDAZOLE (FLAGYL) IVPB 500 mg  500 mg Intravenous Once Messick, Peter C, MD       Current Outpatient Medications  Medication Sig Dispense Refill   acetaminophen  (TYLENOL ) 500 MG tablet Take 500 mg by mouth daily as needed (pain).     amLODipine  (NORVASC ) 5 MG tablet Take 5 mg by mouth daily.  0   aspirin EC 81 MG tablet Take 81 mg by mouth at bedtime.     Calcium  Citrate-Vitamin D (CITRACAL MAXIMUM) 315-250 MG-UNIT TABS Take 1 tablet by mouth daily.     Cholecalciferol (VITAMIN D) 2000 UNITS tablet Take 2,000 Units by mouth daily.     Coenzyme Q10 100 MG capsule Take 100 mg by mouth daily.     EPINEPHrine (EPIPEN 2-PAK) 0.3 mg/0.3 mL IJ SOAJ injection Inject 0.3 mg into the muscle as needed (allergic reactions).     levothyroxine  (SYNTHROID ,  LEVOTHROID) 25 MCG tablet Take 37.5 mcg by mouth daily before breakfast.   1   loratadine (CLARITIN) 10 MG tablet Take 10 mg by mouth daily as needed for allergies.     Magnesium 250 MG TABS Take 250 mg by mouth daily.     Omega 3-Lutein-Zeaxanthin (ADVANCED EYE HEALTH) 250-2.5-0.5 MG CAPS Take 1 tablet by mouth daily.     Omega-3 Fatty Acids (FISH OIL) 1200 MG CAPS Take 1,200 mg by mouth daily.     pantoprazole  (PROTONIX ) 40 MG tablet Take 40 mg by mouth 2 (two) times daily.  1   Polyethyl Glycol-Propyl Glycol (SYSTANE OP) Place 1 drop into both eyes 2 (two) times daily.     Polysaccharide Iron Complex (FERREX 150 PO) Take 150 mg by mouth daily.     raloxifene  (EVISTA ) 60 MG tablet Take 60 mg by mouth daily after lunch.   1  simvastatin  (ZOCOR ) 40 MG tablet Take 40 mg by mouth at bedtime.  0   sodium chloride  (OCEAN) 0.65 % SOLN nasal spray Place 1 spray into both nostrils 2 (two) times daily.     valsartan (DIOVAN) 320 MG tablet Take 320 mg by mouth daily.  0   vitamin B-12 (CYANOCOBALAMIN) 1000 MCG tablet Take 1,000 mcg by mouth daily.      Allergies as of 12/30/2023 - Review Complete 04/03/2015  Allergen Reaction Noted   Hornet venom Anaphylaxis 02/15/2015   Yellow jacket venom [bee venom] Anaphylaxis 02/15/2015    Family History  Problem Relation Age of Onset   Congestive Heart Failure Mother        died @ 58.   Lung cancer Father        died @ 18.   Other Sister        alive and well   Other Sister        died in her 85's of unknown causes.    Social History   Socioeconomic History   Marital status: Widowed    Spouse name: Not on file   Number of children: Not on file   Years of education: Not on file   Highest education level: Not on file  Occupational History   Not on file  Tobacco Use   Smoking status: Never   Smokeless tobacco: Not on file  Substance and Sexual Activity   Alcohol use: No   Drug use: No   Sexual activity: Not on file  Other Topics Concern    Not on file  Social History Narrative   Lives in [place] by herself.  Husband died in 01-25-13.  Very active.   Social Drivers of Corporate investment banker Strain: Not on file  Food Insecurity: Not on file  Transportation Needs: Not on file  Physical Activity: Not on file  Stress: Not on file  Social Connections: Not on file  Intimate Partner Violence: Not on file    Review of Systems: As per HPI.  Physical Exam: Vital signs in last 24 hours: Temp:  [97.7 F (36.5 C)-98.7 F (37.1 C)] 97.7 F (36.5 C) (06/14 1033) Pulse Rate:  [85-87] 87 (06/14 1039) Resp:  [17-21] 21 (06/14 1039) BP: (144-166)/(77-81) 144/77 (06/14 1039) SpO2:  [99 %-100 %] 99 % (06/14 1039) Weight:  [70.3 kg] 70.3 kg (06/14 0744)    General:   Alert,  Well-developed, well-nourished, pleasant and cooperative in NAD Head:  Normocephalic and atraumatic. Eyes:  Sclera clear, no icterus.   Conjunctiva pink. Ears:  Normal auditory acuity. Nose:  No deformity, discharge,  or lesions. Mouth:  No deformity or lesions.  Oropharynx pink & moist. Neck:  Supple; no masses or thyromegaly. Lungs:  Clear throughout to auscultation.   No wheezes, crackles, or rhonchi. No acute distress. Heart:  Regular rate and rhythm; no murmurs, clicks, rubs,  or gallops. Extremities:  Without clubbing or edema. Neurologic:  Alert and  oriented x4;  grossly normal neurologically. Skin:  Intact without significant lesions or rashes. Psych:  Alert and cooperative. Normal mood and affect. Abdomen:  Soft, mild left-sided tenderness and nondistended. No masses, hepatosplenomegaly or hernias noted. Normal bowel sounds, without guarding, and without rebound.         Lab Results: Recent Labs    12/30/23 0744  WBC 14.9*  HGB 13.8  HCT 41.0  PLT 301   BMET Recent Labs    12/30/23 0744  NA 128*  K 4.0  CL 95*  CO2 21*  GLUCOSE 138*  BUN 10  CREATININE 0.97  CALCIUM  8.7*   LFT Recent Labs    12/30/23 0744  PROT 6.6   ALBUMIN 3.7  AST 21  ALT 13  ALKPHOS 59  BILITOT 0.9   PT/INR No results for input(s): LABPROT, INR in the last 72 hours.  Studies/Results: CT Angio Abd/Pel W and/or Wo Contrast Result Date: 12/30/2023 CLINICAL DATA:  Lower GI bleed EXAM: CTA ABDOMEN AND PELVIS WITHOUT AND WITH CONTRAST TECHNIQUE: Multidetector CT imaging of the abdomen and pelvis was performed using the standard protocol during bolus administration of intravenous contrast. Multiplanar reconstructed images and MIPs were obtained and reviewed to evaluate the vascular anatomy. RADIATION DOSE REDUCTION: This exam was performed according to the departmental dose-optimization program which includes automated exposure control, adjustment of the mA and/or kV according to patient size and/or use of iterative reconstruction technique. CONTRAST:  100mL OMNIPAQUE IOHEXOL 350 MG/ML SOLN COMPARISON:  None Available. FINDINGS: VASCULAR Aorta: Patent, mild atherosclerotic changes. Celiac: Patent without evidence of aneurysm, dissection, vasculitis or significant stenosis. SMA: Patent without evidence of aneurysm, dissection, vasculitis or significant stenosis. Renals: Both renal arteries are patent. A moderate plaque is present at the origin the proximal left renal artery. IMA: Patent. Inflow: Patent. Proximal Outflow: Patent. Veins: No obvious venous abnormality within the limitations of this arterial phase study. Review of the MIP images confirms the above findings. NON-VASCULAR Lower chest: Hiatal hernia. Hepatobiliary: Small liver cysts. Pancreas: Unremarkable. No pancreatic ductal dilatation or surrounding inflammatory changes. Spleen: Normal in size without focal abnormality. Adrenals/Urinary Tract: Small left renal cyst  estimated at 1.4 cm. Stomach/Bowel: Hiatal hernia. No dilated loops of small bowel are appreciated. There is no evidence of appendicitis. Beginning in the distal transverse colon, the colon is decompressed and demonstrates  diffuse wall thickening with associated inflammatory stranding in the adjacent fat. In the distal left colon there are areas of diverticular change. Lymphatic: Nothing significant. Reproductive: Pessary. Other: Nothing significant. Musculoskeletal: No acute or significant osseous findings. IMPRESSION: 1. No evidence of active arterial extravasation into the GI tract. 2. Inflammatory changes involving the left colon which are favored to represent sequelae of colitis. Diverticulitis is considered less likely secondary to the length of segment and diffuse nature. There is no evidence of abscess formation or perforation. Electronically Signed   By: Reagan Camera M.D.   On: 12/30/2023 10:48    Impression: Abdominal pain, multiple episodes of bright red blood per rectum, inflammatory change involving left colon  Clinical picture suspicious for ischemic colitis, less likely diverticulitis CTA did not show any evidence of active arterial extravasation  History of colon cancer, status post resection 1991, last colonoscopy in 2021  Hyponatremia, sodium 128 Mild acidosis, bicarb 21 Normal BUN/creatinine with slightly low GFR of 57 Mild leukocytosis, 14.9 Normal hemoglobin 13.8?  Hemoconcentration  Plan: Clinical picture suspicious for ischemic colitis, with should be self-limited.  Will start patient on full liquid diet.  Agree with IV fluid, currently at 75 mL/h of lactated Ringer's  On empiric antibiotics, Rocephin 2 g IV x 1 dose and then every 24 hours, Flagyl 500 mg every 12 hours.  If there is no further rectal bleeding, patient has improvement in abdominal pain, remains hemodynamically stable, possible DC home in a.m..  I will continue to follow.   LOS: 0 days   Genell Ken, MD  12/30/2023, 11:09 AM

## 2023-12-30 NOTE — H&P (Signed)
 [facility] H&P   Patient Demographics:    Allison Knight, is a 88 y.o. female  MRN: [id number]   DOB - [birth year]  Admit Date - 12/30/2023  Outpatient Primary MD for the patient is Sun, Vyvyan, MD  Outpatient Specialists: Dr Kimble Pennant    Patient coming from: Home  Chief Complaint  Patient presents with   Abdominal Pain   Rectal Bleeding      HPI:    Allison Knight  is a 88 y.o. female, with history of colon cancer s/p surgical resection about 30 years ago, anemia of chronic disease, hypertension, dyslipidemia, dehydration with hyponatremia in the past who presents to the hospital with lower abdominal pain, cramping, diarrhea and some bright red blood in stool.  Patient was in good state of health, she went to a graduation party few days ago, had some outside food, yesterday evening she started having lower abdominal cramping with diarrhea, after the diarrheal episode she had 7-8 episodes of bright red blood per rectum, presented to the ER where a CT scan was suggestive of left-sided colitis, H&H was relatively stable, [facility] was consulted and I was requested to admit the patient.  Patient currently is due to be symptom-free except GI bleed as above, she denies any headache, no chest pain or shortness of breath, some ongoing lower abdominal cramping, no fever or chills, no dysuria or focal weakness.    Review of systems:    A full 10 point Review of Systems was done, except as stated above, all other Review of Systems were negative.   With Past History of the following :    Past Medical History:  Diagnosis Date   Anemia    Colon cancer (HCC)    GERD (gastroesophageal reflux disease)    Hiatal hernia    Hyperlipemia     Hypertension    Syncope    a. 02/2015 Echo: Ef 60-65%, Gr 1 DD;  b. Felt to be dehydrated, HCTZ d/c'd.   Thyroid  disease       Past Surgical History:  Procedure Laterality Date   COLON SURGERY        Social History:     Social History   Tobacco Use   Smoking status: Never   Smokeless tobacco: Not on file  Substance Use Topics   Alcohol use: No  Family History :     Family History  Problem Relation Age of Onset   Congestive Heart Failure Mother        died @ 37.   Lung cancer Father        died @ 26.   Other Sister        alive and well   Other Sister        died in her 43's of unknown causes.       Home Medications:   Prior to Admission medications   Medication Sig Start Date End Date Taking? Authorizing Provider  famotidine (PEPCID) 40 MG tablet Take 40 mg by mouth at bedtime. 11/10/23  Yes [provider]  omeprazole (PRILOSEC) 40 MG capsule Take 40 mg by mouth every morning. 11/10/23  Yes [provider]  acetaminophen  (TYLENOL ) 500 MG tablet Take 500 mg by mouth daily as needed (pain).    [provider]  amLODipine  (NORVASC ) 5 MG tablet Take 5 mg by mouth daily. 02/09/15   [provider]  aspirin EC 81 MG tablet Take 81 mg by mouth at bedtime.    [provider]  Calcium  Citrate-Vitamin D (CITRACAL MAXIMUM) 315-250 MG-UNIT TABS Take 1 tablet by mouth daily.    [provider]  Cholecalciferol (VITAMIN D) 2000 UNITS tablet Take 2,000 Units by mouth daily.    [provider]  Coenzyme Q10 100 MG capsule Take 100 mg by mouth daily.    [provider]  EPINEPHrine (EPIPEN 2-PAK) 0.3 mg/0.3 mL IJ SOAJ injection Inject 0.3 mg into the muscle as needed (allergic reactions).    [provider]  irbesartan  (AVAPRO ) 300 MG tablet Take 300 mg by mouth every morning.    [provider]  levothyroxine  (SYNTHROID ) 50 MCG tablet Take 50 mcg by mouth every morning.     [provider]  levothyroxine  (SYNTHROID , LEVOTHROID) 25 MCG tablet Take 37.5 mcg by mouth daily before breakfast.  02/09/15   [provider]  loratadine (CLARITIN) 10 MG tablet Take 10 mg by mouth daily as needed for allergies.    [provider]  Magnesium 250 MG TABS Take 250 mg by mouth daily.    [provider]  Omega 3-Lutein-Zeaxanthin (ADVANCED EYE HEALTH) 250-2.5-0.5 MG CAPS Take 1 tablet by mouth daily.    [provider]  Omega-3 Fatty Acids (FISH OIL) 1200 MG CAPS Take 1,200 mg by mouth daily.    [provider]  pantoprazole  (PROTONIX ) 40 MG tablet Take 40 mg by mouth 2 (two) times daily. 02/13/15   [provider]  Polyethyl Glycol-Propyl Glycol (SYSTANE OP) Place 1 drop into both eyes 2 (two) times daily.    [provider]  Polysaccharide Iron Complex (FERREX 150 PO) Take 150 mg by mouth daily.    [provider]  raloxifene  (EVISTA ) 60 MG tablet Take 60 mg by mouth daily after lunch.  02/05/15   [provider]  simvastatin  (ZOCOR ) 40 MG tablet Take 40 mg by mouth at bedtime. 02/09/15   [provider]  sodium chloride  (OCEAN) 0.65 % SOLN nasal spray Place 1 spray into both nostrils 2 (two) times daily.    [provider]  valsartan (DIOVAN) 320 MG tablet Take 320 mg by mouth daily. 03/26/15   [provider]  vitamin B-12 (CYANOCOBALAMIN) 1000 MCG tablet Take 1,000 mcg by mouth daily.    [provider]     Allergies:     Allergies  Allergen Reactions   Hornet Venom Anaphylaxis   Yellow Jacket Venom [Bee Venom] Anaphylaxis     Physical Exam:   Vitals  Blood pressure (!) 144/77, pulse 87, temperature 97.7 F (36.5 C), temperature source Oral, resp. rate (!) 21, height 5' 1 (1.549 m), weight 70.3 kg, SpO2 99%.   1. General Pleasant elderly Caucasian female lying in hospital bed in no apparent discomfort  2. Normal affect and insight, Not  Suicidal or Homicidal, Awake Alert,   3. No F.N deficits, ALL C.Nerves Intact, Strength 5/5 all 4 extremities, Sensation intact all 4 extremities, Plantars down going.  4. Ears and Eyes appear Normal, Conjunctivae clear, PERRLA. Moist Oral Mucosa.  5. Supple Neck, No JVD, No cervical lymphadenopathy appriciated, No Carotid Bruits.  6. Symmetrical Chest wall movement, Good air movement bilaterally, CTAB.  7. RRR, No Gallops, Rubs or Murmurs, No Parasternal Heave.  8. Positive Bowel Sounds, Abdomen Soft, mild suprapubic and lower abdominal tenderness, No organomegaly appriciated,No rebound -guarding or rigidity.  9.  No Cyanosis, Normal Skin Turgor, No Skin Rash or Bruise.  10. Good muscle tone,  joints appear normal , no effusions, Normal ROM.  11. No Palpable Lymph Nodes in Neck or Axillae      Data Review:   Recent Labs  Lab 12/30/23 0744  WBC 14.9*  HGB 13.8  HCT 41.0  PLT 301  MCV 91.9  MCH 30.9  MCHC 33.7  RDW 13.8    Recent Labs  Lab 12/30/23 0744  NA 128*  K 4.0  CL 95*  CO2 21*  ANIONGAP 12  GLUCOSE 138*  BUN 10  CREATININE 0.97  AST 21  ALT 13  ALKPHOS 59  BILITOT 0.9  ALBUMIN 3.7  CALCIUM  8.7*    No results found for: CHOL, HDL, LDLCALC, LDLDIRECT, TRIG, CHOLHDL  Recent Labs  Lab 12/30/23 0744  CALCIUM  8.7*    Recent Labs  Lab 12/30/23 0744  WBC 14.9*  PLT 301  CREATININE 0.97    Urinalysis    Component Value Date/Time   COLORURINE YELLOW 12/30/2023 0747   APPEARANCEUR CLOUDY (A) 12/30/2023 0747   LABSPEC 1.017 12/30/2023 0747   PHURINE 6.0 12/30/2023 0747   GLUCOSEU NEGATIVE 12/30/2023 0747   HGBUR NEGATIVE 12/30/2023 0747   BILIRUBINUR NEGATIVE 12/30/2023 0747   KETONESUR NEGATIVE 12/30/2023 0747   PROTEINUR NEGATIVE 12/30/2023 0747   UROBILINOGEN 0.2 02/16/2015 0847   NITRITE NEGATIVE 12/30/2023 0747   LEUKOCYTESUR NEGATIVE 12/30/2023 0747      Imaging Results:    CT Angio Abd/Pel W and/or Wo  Contrast Result Date: 12/30/2023 CLINICAL DATA:  Lower GI bleed EXAM: CTA ABDOMEN AND PELVIS WITHOUT AND WITH CONTRAST TECHNIQUE: Multidetector CT imaging of the abdomen and pelvis was performed using the standard protocol during bolus administration of intravenous contrast. Multiplanar reconstructed images and MIPs were obtained and reviewed to evaluate the vascular anatomy. RADIATION DOSE REDUCTION: This exam was performed according to the departmental dose-optimization program which includes automated exposure control, adjustment of the mA and/or kV according to patient size and/or use of iterative reconstruction technique. CONTRAST:  100mL OMNIPAQUE IOHEXOL 350 MG/ML SOLN COMPARISON:  None Available. FINDINGS: VASCULAR Aorta: Patent, mild atherosclerotic changes. Celiac: Patent without evidence of aneurysm, dissection, vasculitis or significant stenosis. SMA: Patent without evidence of aneurysm, dissection, vasculitis or significant stenosis. Renals: Both renal arteries are patent. A moderate plaque is present at the origin the proximal left renal artery. IMA: Patent. Inflow: Patent. Proximal Outflow: Patent. Veins: No obvious venous abnormality  within the limitations of this arterial phase study. Review of the MIP images confirms the above findings. NON-VASCULAR Lower chest: Hiatal hernia. Hepatobiliary: Small liver cysts. Pancreas: Unremarkable. No pancreatic ductal dilatation or surrounding inflammatory changes. Spleen: Normal in size without focal abnormality. Adrenals/Urinary Tract: Small left renal cyst  estimated at 1.4 cm. Stomach/Bowel: Hiatal hernia. No dilated loops of small bowel are appreciated. There is no evidence of appendicitis. Beginning in the distal transverse colon, the colon is decompressed and demonstrates diffuse wall thickening with associated inflammatory stranding in the adjacent fat. In the distal left colon there are areas of diverticular change. Lymphatic: Nothing significant.  Reproductive: Pessary. Other: Nothing significant. Musculoskeletal: No acute or significant osseous findings. IMPRESSION: 1. No evidence of active arterial extravasation into the GI tract. 2. Inflammatory changes involving the left colon which are favored to represent sequelae of colitis. Diverticulitis is considered less likely secondary to the length of segment and diffuse nature. There is no evidence of abscess formation or perforation. Electronically Signed   By: Reagan Camera M.D.   On: 12/30/2023 10:48    My personal review of EKG: Rhythm NSR, no acute ST changes   Assessment & Plan:    1.  Acute lower GI bleed.  Clinically appears to be consistent with diverticular bleed related to diverticulitis, she does have remote history of colon cancer as well, currently H&H is stable, continue to monitor CBC, type screen, she is agreeable for transfusion as needed, home aspirin will be held.  Bowel rest, IV fluids, IV Unasyn or Rocephin plus Flagyl, CTA reassuring with no active bleeding identified, [facility] consulted will see the patient shortly.  2.  Dehydration and hyponatremia.  IV fluids, obtain serum osmolality along with urine osmolality.  3.  Hypertension.  Home dose Norvasc  and as needed hydralazine.  Hold ACE inhibitor for now.  4.  Hypothyroidism.  Continue home Synthroid .  5.  GERD.  Continue Prilosec.  6.  History of B12 deficiency.  Continue replacement.    DVT Prophylaxis  SCDs    AM Labs Ordered, also please review Full Orders  Family Communication: Admission, patients condition and plan of care including tests being ordered have been discussed with the patient who indicate understanding and agree with the plan and Code Status.  Code Status Full  Likely DC to  Home  Condition fair  Consults called: [facility]    Admission status: Inpt    Time spent in minutes : 35  Signature  -    Lynnwood Sauer M.D on 12/30/2023 at 11:26 AM   -  To page go to [email or web address]

## 2023-12-30 NOTE — ED Provider Notes (Signed)
 [place] [facility] AT [facility] Provider Note   CSN: [id number] Arrival date & time: 12/30/23  4098     Patient presents with: Abdominal Pain and Rectal Bleeding   Allison Knight is a 88 y.o. female.   88 year old female with prior medical history as detailed below presents for evaluation.  Patient takes aspirin 3 times a week.  She reports bloody stool x 8 hours.  She reports a prior history of colon cancer.  Prior GI care was with Dr. Kimble Pennant.  She denies any prior significant history of GI bleeding.  She denies prior need for transfusion.  She reports passage of bright red blood per rectum with multiple BMs since midnight.  She denies chest pain.  She complains of diffuse low abdominal crampy discomfort.  The history is provided by the patient and medical records.       Prior to Admission medications   Medication Sig Start Date End Date Taking? Authorizing Provider  acetaminophen  (TYLENOL ) 500 MG tablet Take 500 mg by mouth daily as needed (pain).    [provider]  amLODipine  (NORVASC ) 5 MG tablet Take 5 mg by mouth daily. 02/09/15   [provider]  aspirin EC 81 MG tablet Take 81 mg by mouth at bedtime.    [provider]  Calcium  Citrate-Vitamin D (CITRACAL MAXIMUM) 315-250 MG-UNIT TABS Take 1 tablet by mouth daily.    [provider]  Cholecalciferol (VITAMIN D) 2000 UNITS tablet Take 2,000 Units by mouth daily.    [provider]  Coenzyme Q10 100 MG capsule Take 100 mg by mouth daily.    [provider]  EPINEPHrine (EPIPEN 2-PAK) 0.3 mg/0.3 mL IJ SOAJ injection Inject 0.3 mg into the muscle as needed (allergic reactions).    [provider]  levothyroxine  (SYNTHROID , LEVOTHROID) 25 MCG tablet Take 37.5 mcg by mouth daily before breakfast.  02/09/15   [provider]  loratadine (CLARITIN) 10 MG tablet Take 10 mg by mouth daily as needed for allergies.    [provider]   Magnesium 250 MG TABS Take 250 mg by mouth daily.    [provider]  Omega 3-Lutein-Zeaxanthin (ADVANCED EYE HEALTH) 250-2.5-0.5 MG CAPS Take 1 tablet by mouth daily.    [provider]  Omega-3 Fatty Acids (FISH OIL) 1200 MG CAPS Take 1,200 mg by mouth daily.    [provider]  pantoprazole  (PROTONIX ) 40 MG tablet Take 40 mg by mouth 2 (two) times daily. 02/13/15   [provider]  Polyethyl Glycol-Propyl Glycol (SYSTANE OP) Place 1 drop into both eyes 2 (two) times daily.    [provider]  Polysaccharide Iron Complex (FERREX 150 PO) Take 150 mg by mouth daily.    [provider]  raloxifene  (EVISTA ) 60 MG tablet Take 60 mg by mouth daily after lunch.  02/05/15   [provider]  simvastatin  (ZOCOR ) 40 MG tablet Take 40 mg by mouth at bedtime. 02/09/15   [provider]  sodium chloride  (OCEAN) 0.65 % SOLN nasal spray Place 1 spray into both nostrils 2 (two) times daily.    [provider]  valsartan (DIOVAN) 320 MG tablet Take 320 mg by mouth daily. 03/26/15   [provider]  vitamin B-12 (CYANOCOBALAMIN) 1000 MCG tablet Take 1,000 mcg by mouth daily.    [provider]    Allergies: Hornet venom and Yellow jacket venom [bee venom]    Review of Systems  All other systems  reviewed and are negative.   Updated Vital Signs BP (!) 166/81 (BP Location: Right Arm)   Pulse 85   Temp 98.7 F (37.1 C)   Resp 20   Ht 5' 1 (1.549 m)   Wt 70.3 kg   SpO2 100%   BMI 29.29 kg/m   Physical Exam Vitals and nursing note reviewed.  Constitutional:      General: She is not in acute distress.    Appearance: Normal appearance. She is well-developed.  HENT:     Head: Normocephalic and atraumatic.   Eyes:     Conjunctiva/sclera: Conjunctivae normal.     Pupils: Pupils are equal, round, and reactive to light.    Cardiovascular:     Rate and Rhythm: Normal rate and regular rhythm.     Heart  sounds: Normal heart sounds.  Pulmonary:     Effort: Pulmonary effort is normal. No respiratory distress.     Breath sounds: Normal breath sounds.  Abdominal:     General: There is no distension.     Palpations: Abdomen is soft.     Tenderness: There is no abdominal tenderness.   Musculoskeletal:        General: No deformity. Normal range of motion.     Cervical back: Normal range of motion and neck supple.   Skin:    General: Skin is warm and dry.   Neurological:     General: No focal deficit present.     Mental Status: She is alert and oriented to person, place, and time.     (all labs ordered are listed, but only abnormal results are displayed) Labs Reviewed  LIPASE, BLOOD  COMPREHENSIVE METABOLIC PANEL WITH GFR  CBC  URINALYSIS, ROUTINE W REFLEX MICROSCOPIC  POC OCCULT BLOOD, ED  TYPE AND SCREEN    EKG: None  Radiology: No results found.   Procedures   Medications Ordered in the ED - No data to display                                  Medical Decision Making Amount and/or Complexity of Data Reviewed Labs: ordered. Radiology: ordered.  Risk Prescription drug management. Decision regarding hospitalization.    Medical Screen Complete  This patient presented to the ED with complaint of bright red blood per rectum.  This complaint involves an extensive number of treatment options. The initial differential diagnosis includes, but is not limited to, GI bleeding  This presentation is: Acute, Chronic, Self-Limited, Previously Undiagnosed, Uncertain Prognosis, Complicated, Systemic Symptoms, and Threat to Life/Bodily Function  Patient presents with GI bleeding.  She reports bright red blood per rectum since last night.  Distant history of colon cancer with resection reported.  Initial hemodynamics are stable.  Initial hemoglobin is stable.  CT abdomen pelvis without clear extravasation.  However evidence of possible colitis on CT imaging  found.  Antibiotics administered.  Patient would benefit from admission.  [facility] and hospital service aware of case.  Additional history obtained:  External records from outside sources obtained and reviewed including prior ED visits and prior Inpatient records.    Problem List / ED Course:  Bright red blood per rectum   Disposition:  After consideration of the diagnostic results and the patients response to treatment, I feel that the patent would benefit from close outpatient follow-up.       Final diagnoses:  BRBPR (bright red blood per rectum)  ED Discharge Orders     None          Burnette Carte, MD 12/30/23 1622

## 2023-12-31 DIAGNOSIS — K922 Gastrointestinal hemorrhage, unspecified: Secondary | ICD-10-CM | POA: Diagnosis not present

## 2023-12-31 DIAGNOSIS — K529 Noninfective gastroenteritis and colitis, unspecified: Secondary | ICD-10-CM | POA: Diagnosis not present

## 2023-12-31 DIAGNOSIS — K625 Hemorrhage of anus and rectum: Secondary | ICD-10-CM | POA: Diagnosis not present

## 2023-12-31 DIAGNOSIS — Z85038 Personal history of other malignant neoplasm of large intestine: Secondary | ICD-10-CM | POA: Diagnosis not present

## 2023-12-31 DIAGNOSIS — R1030 Lower abdominal pain, unspecified: Secondary | ICD-10-CM | POA: Diagnosis not present

## 2023-12-31 LAB — CBC WITH DIFFERENTIAL/PLATELET
Abs Immature Granulocytes: 0.03 10*3/uL (ref 0.00–0.07)
Basophils Absolute: 0 10*3/uL (ref 0.0–0.1)
Basophils Relative: 0 %
Eosinophils Absolute: 0.1 10*3/uL (ref 0.0–0.5)
Eosinophils Relative: 1 %
HCT: 38.2 % (ref 36.0–46.0)
Hemoglobin: 12.3 g/dL (ref 12.0–15.0)
Immature Granulocytes: 0 %
Lymphocytes Relative: 12 %
Lymphs Abs: 1.4 10*3/uL (ref 0.7–4.0)
MCH: 30.7 pg (ref 26.0–34.0)
MCHC: 32.2 g/dL (ref 30.0–36.0)
MCV: 95.3 fL (ref 80.0–100.0)
Monocytes Absolute: 0.7 10*3/uL (ref 0.1–1.0)
Monocytes Relative: 6 %
Neutro Abs: 9.6 10*3/uL — ABNORMAL HIGH (ref 1.7–7.7)
Neutrophils Relative %: 81 %
Platelets: 249 10*3/uL (ref 150–400)
RBC: 4.01 MIL/uL (ref 3.87–5.11)
RDW: 14.3 % (ref 11.5–15.5)
WBC: 11.8 10*3/uL — ABNORMAL HIGH (ref 4.0–10.5)
nRBC: 0 % (ref 0.0–0.2)

## 2023-12-31 LAB — SODIUM, URINE, RANDOM: Sodium, Ur: 82 mmol/L

## 2023-12-31 LAB — BRAIN NATRIURETIC PEPTIDE: B Natriuretic Peptide: 112.2 pg/mL — ABNORMAL HIGH (ref 0.0–100.0)

## 2023-12-31 LAB — BASIC METABOLIC PANEL WITH GFR
Anion gap: 11 (ref 5–15)
BUN: 6 mg/dL — ABNORMAL LOW (ref 8–23)
CO2: 21 mmol/L — ABNORMAL LOW (ref 22–32)
Calcium: 8.2 mg/dL — ABNORMAL LOW (ref 8.9–10.3)
Chloride: 100 mmol/L (ref 98–111)
Creatinine, Ser: 0.95 mg/dL (ref 0.44–1.00)
GFR, Estimated: 58 mL/min — ABNORMAL LOW (ref >60)
Glucose, Bld: 144 mg/dL — ABNORMAL HIGH (ref 70–99)
Potassium: 3.7 mmol/L (ref 3.5–5.1)
Sodium: 132 mmol/L — ABNORMAL LOW (ref 135–145)

## 2023-12-31 LAB — OSMOLALITY, URINE: Osmolality, Ur: 326 mosm/kg (ref 300–900)

## 2023-12-31 LAB — MAGNESIUM: Magnesium: 1.8 mg/dL (ref 1.7–2.4)

## 2023-12-31 NOTE — Plan of Care (Signed)

## 2023-12-31 NOTE — Progress Notes (Signed)
 Subjective: Reports improvement in lower abdominal cramps. Reports having 3-4 small blood and clot passage per rectum without any stool.  Objective: Vital signs in last 24 hours: Temp:  [98.1 F (36.7 C)-98.7 F (37.1 C)] 98.2 F (36.8 C) (06/15 0800) Pulse Rate:  [69-89] 78 (06/15 0800) Resp:  [14-20] 17 (06/15 0800) BP: (126-169)/(59-76) 135/59 (06/15 0800) SpO2:  [93 %-96 %] 95 % (06/15 0800) Weight:  [70.2 kg] 70.2 kg (06/14 1326) Weight change:  Last BM Date : 12/30/23  PE: Not in distress GENERAL: No pallor  ABDOMEN: Mild lower abdominal tenderness, nondistended, bowel sounds normoactive EXTREMITIES: No deformity  Lab Results: Results for orders placed or performed during the hospital encounter of 12/30/23 (from the past 48 hours)  Lipase, blood     Status: None   Collection Time: 12/30/23  7:44 AM  Result Value Ref Range   Lipase 28 11 - 51 U/L    Comment: Performed at [facility], [address] [place], [place] [address]  Comprehensive metabolic panel     Status: Abnormal   Collection Time: 12/30/23  7:44 AM  Result Value Ref Range   Sodium 128 (L) 135 - 145 mmol/L   Potassium 4.0 3.5 - 5.1 mmol/L   Chloride 95 (L) 98 - 111 mmol/L   CO2 21 (L) 22 - 32 mmol/L   Glucose, Bld 138 (H) 70 - 99 mg/dL    Comment: Glucose reference range applies only to samples taken after fasting for at least 8 hours.   BUN 10 8 - 23 mg/dL   Creatinine, Ser 6.04 0.44 - 1.00 mg/dL   Calcium  8.7 (L) 8.9 - 10.3 mg/dL   Total Protein 6.6 6.5 - 8.1 g/dL   Albumin 3.7 3.5 - 5.0 g/dL   AST 21 15 - 41 U/L   ALT 13 0 - 44 U/L   Alkaline Phosphatase 59 38 - 126 U/L   Total Bilirubin 0.9 0.0 - 1.2 mg/dL   GFR, Estimated 57 (L) >60 mL/min    Comment: (NOTE) Calculated using the CKD-EPI Creatinine Equation (2021)    Anion gap 12 5 - 15    Comment: Performed at [facility], [address] [place], [place] [address]  CBC     Status: Abnormal   Collection Time: 12/30/23  7:44  AM  Result Value Ref Range   WBC 14.9 (H) 4.0 - 10.5 K/uL   RBC 4.46 3.87 - 5.11 MIL/uL   Hemoglobin 13.8 12.0 - 15.0 g/dL   HCT 11.9 14.7 - 82.9 %   MCV 91.9 80.0 - 100.0 fL   MCH 30.9 26.0 - 34.0 pg   MCHC 33.7 30.0 - 36.0 g/dL   RDW 56.2 13.0 - 86.5 %   Platelets 301 150 - 400 K/uL   nRBC 0.0 0.0 - 0.2 %    Comment: Performed at [facility], [address], [place], [place] [address]  Urinalysis, Routine w reflex microscopic -Urine, Clean Catch     Status: Abnormal   Collection Time: 12/30/23  7:47 AM  Result Value Ref Range   Color, Urine YELLOW YELLOW   APPearance CLOUDY (A) CLEAR   Specific Gravity, Urine 1.017 1.005 - 1.030   pH 6.0 5.0 - 8.0   Glucose, UA NEGATIVE NEGATIVE mg/dL   Hgb urine dipstick NEGATIVE NEGATIVE   Bilirubin Urine NEGATIVE NEGATIVE   Ketones, ur NEGATIVE NEGATIVE mg/dL   Protein, ur NEGATIVE NEGATIVE mg/dL   Nitrite NEGATIVE NEGATIVE  Leukocytes,Ua NEGATIVE NEGATIVE    Comment: Performed at [facility], [address] [place], [place] [address]  Type and screen [facility]     Status: None   Collection Time: 12/30/23  8:28 AM  Result Value Ref Range   ABO/RH(D) O POS    Antibody Screen NEG    Sample Expiration      01/02/2024,2359 Performed at [facility], [address], [place], [place] [address]   Culture, blood (routine x 2)     Status: None (Preliminary result)   Collection Time: 12/30/23 11:07 AM   Specimen: BLOOD RIGHT HAND  Result Value Ref Range   Specimen Description BLOOD RIGHT HAND    Special Requests      BOTTLES DRAWN AEROBIC AND ANAEROBIC Blood Culture results may not be optimal due to an inadequate volume of blood received in culture bottles   Culture      NO GROWTH < 24 HOURS Performed at [facility], [address], [place], [place] [address]    Report Status PENDING   Culture, blood (routine x 2)     Status: None (Preliminary result)   Collection Time: 12/30/23 11:12 AM    Specimen: BLOOD LEFT ARM  Result Value Ref Range   Specimen Description BLOOD LEFT ARM    Special Requests      BOTTLES DRAWN AEROBIC AND ANAEROBIC Blood Culture results may not be optimal due to an inadequate volume of blood received in culture bottles   Culture      NO GROWTH < 24 HOURS Performed at [facility], [address], [place], [place] [address]    Report Status PENDING   CBC with Differential/Platelet     Status: Abnormal   Collection Time: 12/30/23  1:41 PM  Result Value Ref Range   WBC 15.8 (H) 4.0 - 10.5 K/uL   RBC 4.56 3.87 - 5.11 MIL/uL   Hemoglobin 14.1 12.0 - 15.0 g/dL   HCT 29.5 62.1 - 30.8 %   MCV 91.2 80.0 - 100.0 fL   MCH 30.9 26.0 - 34.0 pg   MCHC 33.9 30.0 - 36.0 g/dL   RDW 65.7 84.6 - 96.2 %   Platelets 320 150 - 400 K/uL   nRBC 0.0 0.0 - 0.2 %   Neutrophils Relative % 77 %   Neutro Abs 12.0 (H) 1.7 - 7.7 K/uL   Lymphocytes Relative 14 %   Lymphs Abs 2.2 0.7 - 4.0 K/uL   Monocytes Relative 9 %   Monocytes Absolute 1.5 (H) 0.1 - 1.0 K/uL   Eosinophils Relative 0 %   Eosinophils Absolute 0.0 0.0 - 0.5 K/uL   Basophils Relative 0 %   Basophils Absolute 0.1 0.0 - 0.1 K/uL   Immature Granulocytes 0 %   Abs Immature Granulocytes 0.07 0.00 - 0.07 K/uL    Comment: Performed at [facility], [address] [place], [place] [address]  Osmolality     Status: None   Collection Time: 12/30/23  1:41 PM  Result Value Ref Range   Osmolality 279 275 - 295 mOsm/kg    Comment: Performed at [facility], [address], [place], [place] [address]  Uric acid     Status: None   Collection Time: 12/30/23  1:41 PM  Result Value Ref Range   Uric Acid, Serum 4.4 2.5 - 7.1 mg/dL    Comment: HEMOLYSIS AT THIS LEVEL MAY AFFECT RESULT Performed at  [facility], [address] [place], [place], [place] [address]   CBC with Differential/Platelet     Status: Abnormal   Collection Time: 12/30/23  8:56 PM  Result Value Ref Range   WBC 14.5 (H) 4.0 - 10.5  K/uL   RBC 4.06 3.87 - 5.11 MIL/uL   Hemoglobin 12.7 12.0 - 15.0 g/dL   HCT 60.4 54.0 - 98.1 %   MCV 92.6 80.0 - 100.0 fL   MCH 31.3 26.0 - 34.0 pg   MCHC 33.8 30.0 - 36.0 g/dL   RDW 19.1 47.8 - 29.5 %   Platelets 280 150 - 400 K/uL   nRBC 0.0 0.0 - 0.2 %   Neutrophils Relative % 78 %   Neutro Abs 11.2 (H) 1.7 - 7.7 K/uL   Lymphocytes Relative 10 %   Lymphs Abs 1.5 0.7 - 4.0 K/uL   Monocytes Relative 12 %   Monocytes Absolute 1.7 (H) 0.1 - 1.0 K/uL   Eosinophils Relative 0 %   Eosinophils Absolute 0.1 0.0 - 0.5 K/uL   Basophils Relative 0 %   Basophils Absolute 0.1 0.0 - 0.1 K/uL   Immature Granulocytes 0 %   Abs Immature Granulocytes 0.06 0.00 - 0.07 K/uL    Comment: Performed at [facility], [address], [place], [place] [address]  Sodium, urine, random     Status: None   Collection Time: 12/31/23  6:47 AM  Result Value Ref Range   Sodium, Ur 82 mmol/L    Comment: Performed at [facility], [address] [place], [place] [address]  Osmolality, urine     Status: None   Collection Time: 12/31/23  6:47 AM  Result Value Ref Range   Osmolality, Ur 326 300 - 900 mOsm/kg    Comment: Performed at [facility], [address] [place], [place] [address]  CBC with Differential/Platelet     Status: Abnormal   Collection Time: 12/31/23  7:56 AM  Result Value Ref Range   WBC 11.8 (H) 4.0 - 10.5 K/uL   RBC 4.01 3.87 - 5.11 MIL/uL   Hemoglobin 12.3 12.0 - 15.0 g/dL   HCT 95.2 84.1 - 32.4 %   MCV 95.3 80.0 - 100.0 fL   MCH 30.7 26.0 - 34.0 pg   MCHC 32.2 30.0 - 36.0 g/dL   RDW 40.1 02.7 - 25.3 %   Platelets 249 150 - 400 K/uL   nRBC 0.0 0.0 - 0.2 %   Neutrophils Relative % 81 %   Neutro Abs 9.6 (H) 1.7 - 7.7 K/uL   Lymphocytes Relative 12 %   Lymphs Abs 1.4 0.7 - 4.0 K/uL   Monocytes Relative 6 %   Monocytes Absolute 0.7 0.1 - 1.0 K/uL   Eosinophils Relative 1 %   Eosinophils Absolute 0.1 0.0 - 0.5 K/uL   Basophils Relative 0 %   Basophils Absolute 0.0  0.0 - 0.1 K/uL   Immature Granulocytes 0 %   Abs Immature Granulocytes 0.03 0.00 - 0.07 K/uL    Comment: Performed at [facility], [address] [place], [place] [address]  Basic metabolic panel with GFR     Status: Abnormal   Collection Time: 12/31/23  7:56 AM  Result Value Ref Range   Sodium 132 (L) 135 - 145 mmol/L   Potassium 3.7 3.5 - 5.1 mmol/L   Chloride 100 98 - 111 mmol/L   CO2 21 (L) 22 - 32 mmol/L   Glucose, Bld  144 (H) 70 - 99 mg/dL    Comment: Glucose reference range applies only to samples taken after fasting for at least 8 hours.   BUN 6 (L) 8 - 23 mg/dL   Creatinine, Ser 8.65 0.44 - 1.00 mg/dL   Calcium  8.2 (L) 8.9 - 10.3 mg/dL   GFR, Estimated 58 (L) >60 mL/min    Comment: (NOTE) Calculated using the CKD-EPI Creatinine Equation (2021)    Anion gap 11 5 - 15    Comment: Performed at [facility], [address] [place], [place] [address]  Brain natriuretic peptide     Status: Abnormal   Collection Time: 12/31/23  7:56 AM  Result Value Ref Range   B Natriuretic Peptide 112.2 (H) 0.0 - 100.0 pg/mL    Comment: Performed at [facility], [address] [place], [place] [address]  Magnesium     Status: None   Collection Time: 12/31/23  7:56 AM  Result Value Ref Range   Magnesium 1.8 1.7 - 2.4 mg/dL    Comment: Performed at [facility], [address] [place], [place] [address]    Studies/Results: CT Angio Abd/Pel W and/or Wo Contrast Result Date: 12/30/2023 CLINICAL DATA:  Lower GI bleed EXAM: CTA ABDOMEN AND PELVIS WITHOUT AND WITH CONTRAST TECHNIQUE: Multidetector CT imaging of the abdomen and pelvis was performed using the standard protocol during bolus administration of intravenous contrast. Multiplanar reconstructed images and MIPs were obtained and reviewed to evaluate the vascular anatomy. RADIATION DOSE REDUCTION: This exam was performed according to the departmental dose-optimization program which includes automated exposure  control, adjustment of the mA and/or kV according to patient size and/or use of iterative reconstruction technique. CONTRAST:  100mL OMNIPAQUE IOHEXOL 350 MG/ML SOLN COMPARISON:  None Available. FINDINGS: VASCULAR Aorta: Patent, mild atherosclerotic changes. Celiac: Patent without evidence of aneurysm, dissection, vasculitis or significant stenosis. SMA: Patent without evidence of aneurysm, dissection, vasculitis or significant stenosis. Renals: Both renal arteries are patent. A moderate plaque is present at the origin the proximal left renal artery. IMA: Patent. Inflow: Patent. Proximal Outflow: Patent. Veins: No obvious venous abnormality within the limitations of this arterial phase study. Review of the MIP images confirms the above findings. NON-VASCULAR Lower chest: Hiatal hernia. Hepatobiliary: Small liver cysts. Pancreas: Unremarkable. No pancreatic ductal dilatation or surrounding inflammatory changes. Spleen: Normal in size without focal abnormality. Adrenals/Urinary Tract: Small left renal cyst  estimated at 1.4 cm. Stomach/Bowel: Hiatal hernia. No dilated loops of small bowel are appreciated. There is no evidence of appendicitis. Beginning in the distal transverse colon, the colon is decompressed and demonstrates diffuse wall thickening with associated inflammatory stranding in the adjacent fat. In the distal left colon there are areas of diverticular change. Lymphatic: Nothing significant. Reproductive: Pessary. Other: Nothing significant. Musculoskeletal: No acute or significant osseous findings. IMPRESSION: 1. No evidence of active arterial extravasation into the GI tract. 2. Inflammatory changes involving the left colon which are favored to represent sequelae of colitis. Diverticulitis is considered less likely secondary to the length of segment and diffuse nature. There is no evidence of abscess formation or perforation. Electronically Signed   By: Reagan Camera M.D.   On: 12/30/2023 10:48     Medications: I have reviewed the patient's current medications.  Assessment: Ischemic colitis-blood per rectum and lower abdominal pain with findings of left-sided colitis on CAT scan  Hemoglobin stable, 12.7 yesterday and 12.3 today Leukocytosis improving, 11.8 today  Plan: Diet being advanced to soft today.  Continue IV antibiotics for another 24 hours while in the hospital. Most likely DC home in a.m.  Genell Ken, MD 12/31/2023, 10:58 AM

## 2023-12-31 NOTE — Evaluation (Signed)
 Physical Therapy Evaluation Patient Details Name: Allison Knight MRN: [id number] DOB: 10-24-1935 Today's Date: 12/31/2023  History of Present Illness  88 yo female presents to ED 6/14 with abd pain with heme + stool. Workup for ischemic colitis. PMH includes colon cancer s/p resection ~30 years ago, GERD, HTN, HLD, syncope.  Clinical Impression  Pt presents at anticipated mobility baseline, showing WFL strength, balance, activity tolerance. Pt ambulatory without AD for good hallway distance, navigated flight of steps demonstrating ability to enter home. Pt states she feels at baseline except for mild abdominal discomfort, states she is still passing some blood in stool as of this am but it is more clot and darker. Pt with no further acute or post-acute PT needs. PT to sign off.       If plan is discharge home, recommend the following:     Can travel by private vehicle    Yes     Equipment Recommendations None recommended by PT  Recommendations for Other Services       Functional Status Assessment Patient has not had a recent decline in their functional status     Precautions / Restrictions Precautions Precautions: Fall Restrictions Weight Bearing Restrictions Per Provider Order: No      Mobility  Bed Mobility Overal bed mobility: Modified Independent                  Transfers Overall transfer level: Modified independent                      Ambulation/Gait Ambulation/Gait assistance: Modified independent (Device/Increase time) Gait Distance (Feet): 350 Feet Assistive device: None Gait Pattern/deviations: Step-through pattern, WFL(Within Functional Limits) Gait velocity: slightly decr     General Gait Details: initially mildly unsteady, improved with continued gait and no PT assist needed  Stairs Stairs: Yes Stairs assistance: Supervision Stair Management: One rail Right, Alternating pattern, Forwards Number of Stairs: 10 General stair  comments: supervision for safety, pt using R handrail and step-over-step with no LOB  Wheelchair Mobility     Tilt Bed    Modified Rankin (Stroke Patients Only)       Balance Overall balance assessment: Modified Independent                                           Pertinent Vitals/Pain Pain Assessment Pain Assessment: Faces Faces Pain Scale: Hurts a little bit Pain Location: lower abdomen Pain Descriptors / Indicators: Sore, Discomfort Pain Intervention(s): Limited activity within patient's tolerance, Monitored during session, Repositioned    Home Living Family/patient expects to be discharged to:: Private residence Living Arrangements: Alone Available Help at Discharge: Family Type of Home: House Home Access: Stairs to enter Entrance Stairs-Rails: Right Entrance Stairs-Number of Steps: 3   Home Layout: Two level;Bed/bath upstairs Home Equipment: None      Prior Function Prior Level of Function : Independent/Modified Independent                     Extremity/Trunk Assessment   Upper Extremity Assessment Upper Extremity Assessment: Overall WFL for tasks assessed    Lower Extremity Assessment Lower Extremity Assessment: Overall WFL for tasks assessed    Cervical / Trunk Assessment Cervical / Trunk Assessment: Normal  Communication   Communication Communication: No apparent difficulties    Cognition Arousal: Alert Behavior During Therapy: [facility] for tasks assessed/performed  PT - Cognitive impairments: No apparent impairments                         Following commands: Intact       Cueing Cueing Techniques: Verbal cues     General Comments General comments (skin integrity, edema, etc.): HR 100-107 throughout, min DOE 1/4 but recovers with rest (pt states this is baseline)    Exercises     Assessment/Plan    PT Assessment Patient does not need any further PT services  PT Problem List         PT Treatment  Interventions      PT Goals (Current goals can be found in the Care Plan section)  Acute Rehab PT Goals PT Goal Formulation: All assessment and education complete, DC therapy Time For Goal Achievement: 12/31/23 Potential to Achieve Goals: Good    Frequency       Co-evaluation               AM-PAC PT 6 Clicks Mobility  Outcome Measure Help needed turning from your back to your side while in a flat bed without using bedrails?: None Help needed moving from lying on your back to sitting on the side of a flat bed without using bedrails?: None Help needed moving to and from a bed to a chair (including a wheelchair)?: None Help needed standing up from a chair using your arms (e.g., wheelchair or bedside chair)?: None Help needed to walk in hospital room?: None Help needed climbing 3-5 steps with a railing? : A Little 6 Click Score: 23    End of Session   Activity Tolerance: Patient tolerated treatment well Patient left: in bed;with call bell/phone within reach Nurse Communication: Mobility status PT Visit Diagnosis: Other abnormalities of gait and mobility (R26.89)    Time: 4098-1191 PT Time Calculation (min) (ACUTE ONLY): 19 min   Charges:   PT Evaluation $PT Eval Low Complexity: 1 Low   PT General Charges $$ ACUTE PT VISIT: 1 Visit         Shirlene Doughty, PT DPT Acute Rehabilitation Services Secure Chat Preferred  Office [phone]   Jaymarion Trombly Cydney Draft 12/31/2023, 9:46 AM

## 2023-12-31 NOTE — Plan of Care (Signed)
  Problem: Education: Goal: Knowledge of General Education information will improve Description: Including pain rating scale, medication(s)/side effects and non-pharmacologic comfort measures Outcome: Progressing   Problem: Health Behavior/Discharge Planning: Goal: Ability to manage health-related needs will improve Outcome: Progressing   Problem: Clinical Measurements: Goal: Ability to maintain clinical measurements within normal limits will improve Outcome: Progressing Goal: Will remain free from infection Outcome: Progressing Goal: Diagnostic test results will improve Outcome: Progressing   Problem: Elimination: Goal: Will not experience complications related to bowel motility Outcome: Progressing   Problem: Safety: Goal: Ability to remain free from injury will improve Outcome: Progressing

## 2023-12-31 NOTE — Progress Notes (Signed)
 PROGRESS NOTE                                                                                                                                                                                                             Patient Demographics:    Allison Knight, is a 88 y.o. female, DOB - 05-18-1936, BJY:[id number]  Outpatient Primary MD for the patient is Sun, Vyvyan, MD    LOS - 1  Admit date - 12/30/2023    Chief Complaint  Patient presents with   Abdominal Pain   Rectal Bleeding       Brief Narrative (HPI from H&P)    88 y.o. female, with history of colon cancer s/p surgical resection about 30 years ago, anemia of chronic disease, hypertension, dyslipidemia, dehydration with hyponatremia in the past who presents to the hospital with lower abdominal pain, cramping, diarrhea and some bright red blood in stool.   Patient was in good state of health, she went to a graduation party few days ago, had some outside food, yesterday evening she started having lower abdominal cramping with diarrhea, after the diarrheal episode she had 7-8 episodes of bright red blood per rectum, presented to the ER where a CT scan was suggestive of left-sided colitis, H&H was relatively stable, [facility] was consulted and I was requested to admit the patient.   Subjective:    Allison Knight today has, No headache, No chest pain, No abdominal pain - No Nausea, No new weakness tingling or numbness, no SOB   Assessment  & Plan :   1.  Acute lower GI bleed.  Clinically appears to be consistent with diverticular bleed related to diverticulitis, he has been treated conservatively with IV antibiotics, bowel rest and IV fluids, clinically improving, [facility] on board.  Overall much better, monitor another 24 hours, advance diet to soft, if stable likely discharge home tomorrow with outpatient GI follow-up.   2.  Dehydration and hyponatremia.  IV fluids, obtain  serum osmolality along with urine osmolality.  BMP pending monitor.   3.  Hypertension.  Home dose Norvasc  and as needed hydralazine.  Hold ACE inhibitor for now.   4.  Hypothyroidism.  Continue home Synthroid .   5.  GERD.  Continue Prilosec.   6.  History of B12 deficiency.  Continue replacement.  Condition - Fair  Family Communication  :  None  Code Status :  Full  Consults  :  GI  PUD Prophylaxis : PPI   Procedures  :           Disposition Plan  :    Status is: Inpatient   DVT Prophylaxis  :    SCDs Start: 12/30/23 1116    Lab Results  Component Value Date   PLT 249 12/31/2023    Diet :  Diet Order             DIET SOFT Fluid consistency: Thin  Diet effective now                    Inpatient Medications  Scheduled Meds:  amLODipine   10 mg Oral Daily   cyanocobalamin  1,000 mcg Oral Daily   levothyroxine   50 mcg Oral QAC breakfast   magnesium oxide  400 mg Oral Daily   pantoprazole   40 mg Oral BID   simvastatin   40 mg Oral QHS   Continuous Infusions:  cefTRIAXone (ROCEPHIN)  IV     And   metronidazole 500 mg (12/30/23 2210)   lactated ringers 75 mL/hr at 12/31/23 0438   PRN Meds:.acetaminophen  **OR** acetaminophen , EPINEPHrine, hydrALAZINE, loratadine, mouth rinse, promethazine    Objective:   Vitals:   12/30/23 2325 12/30/23 2326 12/31/23 0437 12/31/23 0800  BP: (!) 126/59  137/63 (!) 135/59  Pulse:  77 69 78  Resp: 16 17 20 17   Temp: 98.6 F (37 C)  98.1 F (36.7 C) 98.2 F (36.8 C)  TempSrc: Oral  Oral Oral  SpO2:  93% 95% 95%  Weight:      Height:        Wt Readings from Last 3 Encounters:  12/30/23 70.2 kg  04/03/15 73 kg  02/19/15 72.6 kg     Intake/Output Summary (Last 24 hours) at 12/31/2023 0902 Last data filed at 12/30/2023 1800 Gross per 24 hour  Intake 768.98 ml  Output --  Net 768.98 ml     Physical Exam  Awake Alert, No new F.N deficits, Normal affect Atlas.AT,PERRAL Supple Neck, No JVD,    Symmetrical Chest wall movement, Good air movement bilaterally, CTAB RRR,No Gallops,Rubs or new Murmurs,  +ve B.Sounds, Abd Soft, No tenderness,   No Cyanosis, Clubbing or edema     RN pressure injury documentation:      Data Review:    Recent Labs  Lab 12/30/23 0744 12/30/23 1341 12/30/23 2056 12/31/23 0756  WBC 14.9* 15.8* 14.5* 11.8*  HGB 13.8 14.1 12.7 12.3  HCT 41.0 41.6 37.6 38.2  PLT 301 320 280 249  MCV 91.9 91.2 92.6 95.3  MCH 30.9 30.9 31.3 30.7  MCHC 33.7 33.9 33.8 32.2  RDW 13.8 13.9 14.2 14.3  LYMPHSABS  --  2.2 1.5 1.4  MONOABS  --  1.5* 1.7* 0.7  EOSABS  --  0.0 0.1 0.1  BASOSABS  --  0.1 0.1 0.0    Recent Labs  Lab 12/30/23 0744  NA 128*  K 4.0  CL 95*  CO2 21*  ANIONGAP 12  GLUCOSE 138*  BUN 10  CREATININE 0.97  AST 21  ALT 13  ALKPHOS 59  BILITOT 0.9  ALBUMIN 3.7  CALCIUM  8.7*      Recent Labs  Lab 12/30/23 0744  CALCIUM  8.7*    --------------------------------------------------------------------------------------------------------------- No results found for: CHOL, HDL, LDLCALC, LDLDIRECT, TRIG, CHOLHDL  Lab Results  Component Value Date  HGBA1C 5.8 (H) 02/15/2015   No results for input(s): TSH, T4TOTAL, FREET4, T3FREE, THYROIDAB in the last 72 hours. No results for input(s): VITAMINB12, FOLATE, FERRITIN, TIBC, IRON, RETICCTPCT in the last 72 hours. ------------------------------------------------------------------------------------------------------------------ Cardiac Enzymes No results for input(s): CKMB, TROPONINI, MYOGLOBIN in the last 168 hours.  Invalid input(s): CK  Micro Results Recent Results (from the past 240 hours)  Culture, blood (routine x 2)     Status: None (Preliminary result)   Collection Time: 12/30/23 11:07 AM   Specimen: BLOOD RIGHT HAND  Result Value Ref Range Status   Specimen Description BLOOD RIGHT HAND  Final   Special Requests   Final    BOTTLES  DRAWN AEROBIC AND ANAEROBIC Blood Culture results may not be optimal due to an inadequate volume of blood received in culture bottles   Culture   Final    NO GROWTH < 24 HOURS Performed at [facility], [address], [place], [place] [address]    Report Status PENDING  Incomplete  Culture, blood (routine x 2)     Status: None (Preliminary result)   Collection Time: 12/30/23 11:12 AM   Specimen: BLOOD LEFT ARM  Result Value Ref Range Status   Specimen Description BLOOD LEFT ARM  Final   Special Requests   Final    BOTTLES DRAWN AEROBIC AND ANAEROBIC Blood Culture results may not be optimal due to an inadequate volume of blood received in culture bottles   Culture   Final    NO GROWTH < 24 HOURS Performed at [facility], [address], [place], [place] [address]    Report Status PENDING  Incomplete    Radiology Report CT Angio Abd/Pel W and/or Wo Contrast Result Date: 12/30/2023 CLINICAL DATA:  Lower GI bleed EXAM: CTA ABDOMEN AND PELVIS WITHOUT AND WITH CONTRAST TECHNIQUE: Multidetector CT imaging of the abdomen and pelvis was performed using the standard protocol during bolus administration of intravenous contrast. Multiplanar reconstructed images and MIPs were obtained and reviewed to evaluate the vascular anatomy. RADIATION DOSE REDUCTION: This exam was performed according to the departmental dose-optimization program which includes automated exposure control, adjustment of the mA and/or kV according to patient size and/or use of iterative reconstruction technique. CONTRAST:  100mL OMNIPAQUE IOHEXOL 350 MG/ML SOLN COMPARISON:  None Available. FINDINGS: VASCULAR Aorta: Patent, mild atherosclerotic changes. Celiac: Patent without evidence of aneurysm, dissection, vasculitis or significant stenosis. SMA: Patent without evidence of aneurysm, dissection, vasculitis or significant stenosis. Renals: Both renal arteries are patent. A moderate plaque is present at the origin the  proximal left renal artery. IMA: Patent. Inflow: Patent. Proximal Outflow: Patent. Veins: No obvious venous abnormality within the limitations of this arterial phase study. Review of the MIP images confirms the above findings. NON-VASCULAR Lower chest: Hiatal hernia. Hepatobiliary: Small liver cysts. Pancreas: Unremarkable. No pancreatic ductal dilatation or surrounding inflammatory changes. Spleen: Normal in size without focal abnormality. Adrenals/Urinary Tract: Small left renal cyst  estimated at 1.4 cm. Stomach/Bowel: Hiatal hernia. No dilated loops of small bowel are appreciated. There is no evidence of appendicitis. Beginning in the distal transverse colon, the colon is decompressed and demonstrates diffuse wall thickening with associated inflammatory stranding in the adjacent fat. In the distal left colon there are areas of diverticular change. Lymphatic: Nothing significant. Reproductive: Pessary. Other: Nothing significant. Musculoskeletal: No acute or significant osseous findings. IMPRESSION: 1. No evidence of active arterial extravasation into the GI tract. 2. Inflammatory changes involving the left colon which are favored to  represent sequelae of colitis. Diverticulitis is considered less likely secondary to the length of segment and diffuse nature. There is no evidence of abscess formation or perforation. Electronically Signed   By: Reagan Camera M.D.   On: 12/30/2023 10:48     Signature  -   Lynnwood Sauer M.D on 12/31/2023 at 9:02 AM   -  To page go to [email or web address]

## 2023-12-31 NOTE — Care Management Obs Status (Signed)
 MEDICARE OBSERVATION STATUS NOTIFICATION   Patient Details  Name: Allison Knight MRN: [id number] Date of Birth: 09-08-1935   Medicare Observation Status Notification Given:  Yes    Omie Bickers, RN 12/31/2023, 10:10 AM

## 2023-12-31 NOTE — Care Management CC44 (Signed)
 Condition Code 44 Documentation Completed  Patient Details  Name: Allison Knight MRN: [id number] Date of Birth: 1935-10-19   Condition Code 44 given:  Yes Patient signature on Condition Code 44 notice:  Yes Documentation of 2 MD's agreement:  Yes Code 44 added to claim:  Yes    Omie Bickers, RN 12/31/2023, 10:10 AM

## 2024-01-01 ENCOUNTER — Other Ambulatory Visit (HOSPITAL_COMMUNITY): Payer: Self-pay

## 2024-01-01 DIAGNOSIS — K922 Gastrointestinal hemorrhage, unspecified: Secondary | ICD-10-CM | POA: Diagnosis not present

## 2024-01-01 LAB — CBC WITH DIFFERENTIAL/PLATELET
Abs Immature Granulocytes: 0.05 10*3/uL (ref 0.00–0.07)
Basophils Absolute: 0.1 10*3/uL (ref 0.0–0.1)
Basophils Relative: 1 %
Eosinophils Absolute: 0.4 10*3/uL (ref 0.0–0.5)
Eosinophils Relative: 3 %
HCT: 34.3 % — ABNORMAL LOW (ref 36.0–46.0)
Hemoglobin: 11.5 g/dL — ABNORMAL LOW (ref 12.0–15.0)
Immature Granulocytes: 0 %
Lymphocytes Relative: 15 %
Lymphs Abs: 1.9 10*3/uL (ref 0.7–4.0)
MCH: 31.1 pg (ref 26.0–34.0)
MCHC: 33.5 g/dL (ref 30.0–36.0)
MCV: 92.7 fL (ref 80.0–100.0)
Monocytes Absolute: 1.2 10*3/uL — ABNORMAL HIGH (ref 0.1–1.0)
Monocytes Relative: 10 %
Neutro Abs: 8.8 10*3/uL — ABNORMAL HIGH (ref 1.7–7.7)
Neutrophils Relative %: 71 %
Platelets: 265 10*3/uL (ref 150–400)
RBC: 3.7 MIL/uL — ABNORMAL LOW (ref 3.87–5.11)
RDW: 14.1 % (ref 11.5–15.5)
WBC: 12.4 10*3/uL — ABNORMAL HIGH (ref 4.0–10.5)
nRBC: 0 % (ref 0.0–0.2)

## 2024-01-01 LAB — BASIC METABOLIC PANEL WITH GFR
Anion gap: 8 (ref 5–15)
BUN: 11 mg/dL (ref 8–23)
CO2: 22 mmol/L (ref 22–32)
Calcium: 8 mg/dL — ABNORMAL LOW (ref 8.9–10.3)
Chloride: 101 mmol/L (ref 98–111)
Creatinine, Ser: 0.99 mg/dL (ref 0.44–1.00)
GFR, Estimated: 56 mL/min — ABNORMAL LOW (ref >60)
Glucose, Bld: 108 mg/dL — ABNORMAL HIGH (ref 70–99)
Potassium: 3.6 mmol/L (ref 3.5–5.1)
Sodium: 131 mmol/L — ABNORMAL LOW (ref 135–145)

## 2024-01-01 LAB — MAGNESIUM: Magnesium: 2 mg/dL (ref 1.7–2.4)

## 2024-01-01 LAB — BRAIN NATRIURETIC PEPTIDE: B Natriuretic Peptide: 33.4 pg/mL (ref 0.0–100.0)

## 2024-01-01 MED ORDER — CEPHALEXIN 500 MG PO CAPS
500.0000 mg | ORAL_CAPSULE | Freq: Three times a day (TID) | ORAL | 0 refills | Status: AC
  Filled 2024-01-01: qty 21, 7d supply, fill #0

## 2024-01-01 MED ORDER — METRONIDAZOLE 500 MG PO TABS
500.0000 mg | ORAL_TABLET | Freq: Two times a day (BID) | ORAL | 0 refills | Status: AC
Start: 2024-01-01 — End: ?
  Filled 2024-01-01: qty 20, 10d supply, fill #0

## 2024-01-01 NOTE — Progress Notes (Signed)
 DISCHARGE NOTE HOME SHATHA HOOSER to be discharged Home per MD order. Discussed prescriptions and follow up appointments with the patient. Prescriptions given to patient; medication list explained in detail. Patient verbalized understanding.  Skin clean, dry and intact without evidence of skin break down, no evidence of skin tears noted. IV catheter discontinued intact. Site without signs and symptoms of complications. Dressing and pressure applied. Pt denies pain at the site currently. No complaints noted.  Patient free of lines, drains, and wounds.   An After Visit Summary (AVS) was printed and given to the patient. Patient escorted via wheelchair, and discharged home via private auto.  Tonda Francisco, RN

## 2024-01-01 NOTE — Discharge Instructions (Signed)
 Follow with Primary MD Sun, Vyvyan, MD in 7 days   Get CBC, CMP  -  checked next visit with your primary MD    Activity: As tolerated with Full fall precautions use walker/cane & assistance as needed  Disposition Home    Diet: Heart Healthy   Special Instructions: If you have smoked or chewed Tobacco  in the last 2 yrs please stop smoking, stop any regular Alcohol  and or any Recreational drug use.  On your next visit with your primary care physician please Get Medicines reviewed and adjusted.  Please request your Prim.MD to go over all Hospital Tests and Procedure/Radiological results at the follow up, please get all Hospital records sent to your Prim MD by signing hospital release before you go home.  If you experience worsening of your admission symptoms, develop shortness of breath, life threatening emergency, suicidal or homicidal thoughts you must seek medical attention immediately by calling 911 or calling your MD immediately  if symptoms less severe.  You Must read complete instructions/literature along with all the possible adverse reactions/side effects for all the Medicines you take and that have been prescribed to you. Take any new Medicines after you have completely understood and accpet all the possible adverse reactions/side effects.   Do not drive when taking Pain medications.  Do not take more than prescribed Pain, Sleep and Anxiety Medications  Wear Seat belts while driving.

## 2024-01-01 NOTE — Progress Notes (Signed)
   01/01/24 1049  TOC Brief Assessment  Insurance and Status Reviewed Administrator Medicare HMO/PPO)  Patient has primary care physician Yes Paulene Boron, Vyvyan, MD)  Home environment has been reviewed from home  Prior level of function: independent  Prior/Current Home Services No current home services  Social Drivers of Health Review SDOH reviewed no interventions necessary  Readmission risk has been reviewed Yes (14%)  Transition of care needs no transition of care needs at this time

## 2024-01-01 NOTE — TOC Transition Note (Signed)
 Transition of Care [facility]) - Discharge Note   Patient Details  Name: Allison Knight MRN: [id number] Date of Birth: 1936/02/28  Transition of Care [facility]) CM/SW Contact:  Eusebio High, RN Phone Number: 01/01/2024, 10:50 AM   Clinical Narrative:     Patient will DC to home today. No TOC needs identified. Patient will follow up as directed on AVS             Patient Goals and CMS Choice            Discharge Placement                       Discharge Plan and Services Additional resources added to the After Visit Summary for                                       Social Drivers of Health (SDOH) Interventions SDOH Screenings   Food Insecurity: No Food Insecurity (12/30/2023)  Housing: Low Risk  (12/30/2023)  Transportation Needs: No Transportation Needs (12/30/2023)  Utilities: Not At Risk (12/30/2023)  Social Connections: Moderately Integrated (12/30/2023)  Tobacco Use: Unknown (12/30/2023)     Readmission Risk Interventions     No data to display

## 2024-01-01 NOTE — Discharge Summary (Signed)
 Allison Knight ZOX:[id number] DOB: Aug 05, 1935 DOA: 12/30/2023  PCP: Sun, Vyvyan, MD  Admit date: 12/30/2023  Discharge date: 01/01/2024  Admitted From: Home   Disposition:  Home   Recommendations for Outpatient Follow-up:   Follow up with PCP in 1-2 weeks  PCP Please obtain BMP/CBC, 2 view CXR in 1week,  (see Discharge instructions)   PCP Please follow up on the following pending results:    Home Health: None   Equipment/Devices: None  Consultations: GI Discharge Condition: Stable    CODE STATUS: Full    Diet Recommendation: Heart Healthy     Chief Complaint  Patient presents with   Abdominal Pain   Rectal Bleeding     Brief history of present illness from the day of admission and additional interim summary    88 y.o. female, with history of colon cancer s/p surgical resection about 30 years ago, anemia of chronic disease, hypertension, dyslipidemia, dehydration with hyponatremia in the past who presents to the hospital with lower abdominal pain, cramping, diarrhea and some bright red blood in stool.   Patient was in good state of health, she went to a graduation party few days ago, had some outside food, yesterday evening she started having lower abdominal cramping with diarrhea, after the diarrheal episode she had 7-8 episodes of bright red blood per rectum, presented to the ER where a CT scan was suggestive of left-sided colitis, H&H was relatively stable, [facility] was consulted and I was requested to admit the patient.                                                                 Hospital Course   1.  Acute lower GI bleed.  Clinically appears to be consistent with diverticular bleed related to diverticulitis, he has been treated conservatively with IV antibiotics, bowel rest and IV fluids, clinically  improving, [facility] was on board.  Overall much better, diet advanced, cleared by GI for discharge, will place on 7 days of Keflex and 10 days of Flagyl starting from today, overall much better will discharge with outpatient PCP and [facility] follow-up.   2.  Dehydration and hyponatremia.  After hydration with IV fluids PCP to recheck in 7 to 10 days.   3.  Hypertension.  Home medications resume upon discharge.   4.  Hypothyroidism.  Continue home Synthroid .   5.  GERD.  Continue Prilosec.     Discharge diagnosis     Principal Problem:   Lower GI bleed    Discharge instructions    Discharge Instructions     Discharge instructions   Complete by: As directed    Follow with Primary MD Sun, Vyvyan, MD in 7 days   Get CBC, CMP  -  checked next visit with your primary MD  Activity: As tolerated with Full fall precautions use walker/cane & assistance as needed  Disposition Home    Diet: Heart Healthy   Special Instructions: If you have smoked or chewed Tobacco  in the last 2 yrs please stop smoking, stop any regular Alcohol  and or any Recreational drug use.  On your next visit with your primary care physician please Get Medicines reviewed and adjusted.  Please request your Prim.MD to go over all Hospital Tests and Procedure/Radiological results at the follow up, please get all Hospital records sent to your Prim MD by signing hospital release before you go home.  If you experience worsening of your admission symptoms, develop shortness of breath, life threatening emergency, suicidal or homicidal thoughts you must seek medical attention immediately by calling 911 or calling your MD immediately  if symptoms less severe.  You Must read complete instructions/literature along with all the possible adverse reactions/side effects for all the Medicines you take and that have been prescribed to you. Take any new Medicines after you have completely understood and accpet all the possible  adverse reactions/side effects.   Do not drive when taking Pain medications.  Do not take more than prescribed Pain, Sleep and Anxiety Medications  Wear Seat belts while driving.   Increase activity slowly   Complete by: As directed        Discharge Medications   Allergies as of 01/01/2024       Reactions   Hornet Venom Anaphylaxis   Yellow Jacket Venom [bee Venom] Anaphylaxis        Medication List     TAKE these medications    alendronate 70 MG tablet Commonly known as: FOSAMAX Take 70 mg by mouth once a week.   amLODipine  5 MG tablet Commonly known as: NORVASC  Take 5 mg by mouth every evening.   cephALEXin 500 MG capsule Commonly known as: KEFLEX Take 1 capsule (500 mg total) by mouth 3 (three) times daily for 7 days.   EpiPen 2-Pak 0.3 mg/0.3 mL Soaj injection Generic drug: EPINEPHrine Inject 0.3 mg into the muscle as needed (allergic reactions).   famotidine 20 MG tablet Commonly known as: PEPCID Take 40 mg by mouth every morning.   irbesartan  300 MG tablet Commonly known as: AVAPRO  Take 300 mg by mouth every morning.   levothyroxine  50 MCG tablet Commonly known as: SYNTHROID  Take 50 mcg by mouth every morning.   metroNIDAZOLE 500 MG tablet Commonly known as: FLAGYL Take 1 tablet (500 mg total) by mouth 2 (two) times daily.   omeprazole 40 MG capsule Commonly known as: PRILOSEC Take 40 mg by mouth daily.   simvastatin  40 MG tablet Commonly known as: ZOCOR  Take 40 mg by mouth at bedtime.         Follow-up Information     Sun, Vyvyan, MD. Schedule an appointment as soon as possible for a visit in 1 week(s).   Specialty: Family Medicine Contact information: [phone] [address] [place] [address] [phone]         Evangeline Hilts, MD. Schedule an appointment as soon as possible for a visit in 1 week(s).   Specialty: Gastroenterology Contact information: [address] [place]  [address] [phone]                 Major procedures and Radiology Reports - PLEASE review detailed and final reports thoroughly  -       CT Angio Abd/Pel W and/or Wo Contrast Result  Date: 12/30/2023 CLINICAL DATA:  Lower GI bleed EXAM: CTA ABDOMEN AND PELVIS WITHOUT AND WITH CONTRAST TECHNIQUE: Multidetector CT imaging of the abdomen and pelvis was performed using the standard protocol during bolus administration of intravenous contrast. Multiplanar reconstructed images and MIPs were obtained and reviewed to evaluate the vascular anatomy. RADIATION DOSE REDUCTION: This exam was performed according to the departmental dose-optimization program which includes automated exposure control, adjustment of the mA and/or kV according to patient size and/or use of iterative reconstruction technique. CONTRAST:  100mL OMNIPAQUE IOHEXOL 350 MG/ML SOLN COMPARISON:  None Available. FINDINGS: VASCULAR Aorta: Patent, mild atherosclerotic changes. Celiac: Patent without evidence of aneurysm, dissection, vasculitis or significant stenosis. SMA: Patent without evidence of aneurysm, dissection, vasculitis or significant stenosis. Renals: Both renal arteries are patent. A moderate plaque is present at the origin the proximal left renal artery. IMA: Patent. Inflow: Patent. Proximal Outflow: Patent. Veins: No obvious venous abnormality within the limitations of this arterial phase study. Review of the MIP images confirms the above findings. NON-VASCULAR Lower chest: Hiatal hernia. Hepatobiliary: Small liver cysts. Pancreas: Unremarkable. No pancreatic ductal dilatation or surrounding inflammatory changes. Spleen: Normal in size without focal abnormality. Adrenals/Urinary Tract: Small left renal cyst  estimated at 1.4 cm. Stomach/Bowel: Hiatal hernia. No dilated loops of small bowel are appreciated. There is no evidence of appendicitis. Beginning in the distal transverse colon, the colon is decompressed and demonstrates  diffuse wall thickening with associated inflammatory stranding in the adjacent fat. In the distal left colon there are areas of diverticular change. Lymphatic: Nothing significant. Reproductive: Pessary. Other: Nothing significant. Musculoskeletal: No acute or significant osseous findings. IMPRESSION: 1. No evidence of active arterial extravasation into the GI tract. 2. Inflammatory changes involving the left colon which are favored to represent sequelae of colitis. Diverticulitis is considered less likely secondary to the length of segment and diffuse nature. There is no evidence of abscess formation or perforation. Electronically Signed   By: Reagan Camera M.D.   On: 12/30/2023 10:48    Micro Results     Recent Results (from the past 240 hours)  Culture, blood (routine x 2)     Status: None (Preliminary result)   Collection Time: 12/30/23 11:07 AM   Specimen: BLOOD RIGHT HAND  Result Value Ref Range Status   Specimen Description BLOOD RIGHT HAND  Final   Special Requests   Final    BOTTLES DRAWN AEROBIC AND ANAEROBIC Blood Culture results may not be optimal due to an inadequate volume of blood received in culture bottles   Culture   Final    NO GROWTH 2 DAYS Performed at [facility], [address] [place], [place] [address]    Report Status PENDING  Incomplete  Culture, blood (routine x 2)     Status: None (Preliminary result)   Collection Time: 12/30/23 11:12 AM   Specimen: BLOOD LEFT ARM  Result Value Ref Range Status   Specimen Description BLOOD LEFT ARM  Final   Special Requests   Final    BOTTLES DRAWN AEROBIC AND ANAEROBIC Blood Culture results may not be optimal due to an inadequate volume of blood received in culture bottles   Culture   Final    NO GROWTH 2 DAYS Performed at [facility], [address] [place], [place] [address]    Report Status PENDING  Incomplete    Today   Subjective    Analisa Sledd today has no headache,no chest abdominal pain,no  new weakness tingling  or numbness, feels much better wants to go home today.     Objective   Blood pressure 145/70, pulse 81, temperature 98.5 F (36.9 C), temperature source Oral, resp. rate (!) 23, height 5' 1 (1.549 m), weight 70.2 kg, SpO2 97%.   Intake/Output Summary (Last 24 hours) at 01/01/2024 1018 Last data filed at 12/31/2023 1800 Gross per 24 hour  Intake 300 ml  Output --  Net 300 ml    Exam  Awake Alert, No new F.N deficits,    Sharon.AT,PERRAL Supple Neck,   Symmetrical Chest wall movement, Good air movement bilaterally, CTAB RRR,No Gallops,   +ve B.Sounds, Abd Soft, Non tender,  No Cyanosis, Clubbing or edema    Data Review   Recent Labs  Lab 12/30/23 0744 12/30/23 1341 12/30/23 2056 12/31/23 0756 01/01/24 0538  WBC 14.9* 15.8* 14.5* 11.8* 12.4*  HGB 13.8 14.1 12.7 12.3 11.5*  HCT 41.0 41.6 37.6 38.2 34.3*  PLT 301 320 280 249 265  MCV 91.9 91.2 92.6 95.3 92.7  MCH 30.9 30.9 31.3 30.7 31.1  MCHC 33.7 33.9 33.8 32.2 33.5  RDW 13.8 13.9 14.2 14.3 14.1  LYMPHSABS  --  2.2 1.5 1.4 1.9  MONOABS  --  1.5* 1.7* 0.7 1.2*  EOSABS  --  0.0 0.1 0.1 0.4  BASOSABS  --  0.1 0.1 0.0 0.1    Recent Labs  Lab 12/30/23 0744 12/31/23 0756 01/01/24 0538  NA 128* 132* 131*  K 4.0 3.7 3.6  CL 95* 100 101  CO2 21* 21* 22  ANIONGAP 12 11 8   GLUCOSE 138* 144* 108*  BUN 10 6* 11  CREATININE 0.97 0.95 0.99  AST 21  --   --   ALT 13  --   --   ALKPHOS 59  --   --   BILITOT 0.9  --   --   ALBUMIN 3.7  --   --   BNP  --  112.2* 33.4  MG  --  1.8 2.0  CALCIUM  8.7* 8.2* 8.0*    Total Time in preparing paper work, data evaluation and todays exam - 35 minutes  Signature  -    Lynnwood Sauer M.D on 01/01/2024 at 10:18 AM   -  To page go to [email or web address]

## 2024-01-01 NOTE — Plan of Care (Signed)

## 2024-01-02 LAB — UREA NITROGEN, URINE: Urea Nitrogen, Ur: 282 mg/dL

## 2024-01-03 DIAGNOSIS — J301 Allergic rhinitis due to pollen: Secondary | ICD-10-CM | POA: Diagnosis not present

## 2024-01-03 DIAGNOSIS — J3081 Allergic rhinitis due to animal (cat) (dog) hair and dander: Secondary | ICD-10-CM | POA: Diagnosis not present

## 2024-01-03 DIAGNOSIS — J3089 Other allergic rhinitis: Secondary | ICD-10-CM | POA: Diagnosis not present

## 2024-01-03 DIAGNOSIS — T63441D Toxic effect of venom of bees, accidental (unintentional), subsequent encounter: Secondary | ICD-10-CM | POA: Diagnosis not present

## 2024-01-04 LAB — CULTURE, BLOOD (ROUTINE X 2)
Culture: NO GROWTH
Culture: NO GROWTH

## 2024-01-08 DIAGNOSIS — E7800 Pure hypercholesterolemia, unspecified: Secondary | ICD-10-CM | POA: Diagnosis not present

## 2024-01-08 DIAGNOSIS — E039 Hypothyroidism, unspecified: Secondary | ICD-10-CM | POA: Diagnosis not present

## 2024-01-08 DIAGNOSIS — I129 Hypertensive chronic kidney disease with stage 1 through stage 4 chronic kidney disease, or unspecified chronic kidney disease: Secondary | ICD-10-CM | POA: Diagnosis not present

## 2024-01-23 DIAGNOSIS — K559 Vascular disorder of intestine, unspecified: Secondary | ICD-10-CM | POA: Diagnosis not present

## 2024-01-24 DIAGNOSIS — H9191 Unspecified hearing loss, right ear: Secondary | ICD-10-CM | POA: Diagnosis not present

## 2024-01-24 DIAGNOSIS — H6121 Impacted cerumen, right ear: Secondary | ICD-10-CM | POA: Diagnosis not present

## 2024-01-30 DIAGNOSIS — N958 Other specified menopausal and perimenopausal disorders: Secondary | ICD-10-CM | POA: Diagnosis not present

## 2024-01-30 DIAGNOSIS — N8110 Cystocele, unspecified: Secondary | ICD-10-CM | POA: Diagnosis not present

## 2024-01-30 DIAGNOSIS — Z4689 Encounter for fitting and adjustment of other specified devices: Secondary | ICD-10-CM | POA: Diagnosis not present

## 2024-01-30 DIAGNOSIS — R32 Unspecified urinary incontinence: Secondary | ICD-10-CM | POA: Diagnosis not present

## 2024-02-05 DIAGNOSIS — J3089 Other allergic rhinitis: Secondary | ICD-10-CM | POA: Diagnosis not present

## 2024-02-05 DIAGNOSIS — J3081 Allergic rhinitis due to animal (cat) (dog) hair and dander: Secondary | ICD-10-CM | POA: Diagnosis not present

## 2024-02-05 DIAGNOSIS — J301 Allergic rhinitis due to pollen: Secondary | ICD-10-CM | POA: Diagnosis not present

## 2024-02-15 DIAGNOSIS — Z4689 Encounter for fitting and adjustment of other specified devices: Secondary | ICD-10-CM | POA: Diagnosis not present

## 2024-02-15 DIAGNOSIS — N958 Other specified menopausal and perimenopausal disorders: Secondary | ICD-10-CM | POA: Diagnosis not present

## 2024-02-19 DIAGNOSIS — R1030 Lower abdominal pain, unspecified: Secondary | ICD-10-CM | POA: Diagnosis not present

## 2024-02-19 DIAGNOSIS — R300 Dysuria: Secondary | ICD-10-CM | POA: Diagnosis not present

## 2024-02-20 DIAGNOSIS — T63441D Toxic effect of venom of bees, accidental (unintentional), subsequent encounter: Secondary | ICD-10-CM | POA: Diagnosis not present

## 2024-02-20 DIAGNOSIS — H1045 Other chronic allergic conjunctivitis: Secondary | ICD-10-CM | POA: Diagnosis not present

## 2024-02-20 DIAGNOSIS — Z4689 Encounter for fitting and adjustment of other specified devices: Secondary | ICD-10-CM | POA: Diagnosis not present

## 2024-02-20 DIAGNOSIS — J4520 Mild intermittent asthma, uncomplicated: Secondary | ICD-10-CM | POA: Diagnosis not present

## 2024-02-20 DIAGNOSIS — J3089 Other allergic rhinitis: Secondary | ICD-10-CM | POA: Diagnosis not present

## 2024-03-13 DIAGNOSIS — J3081 Allergic rhinitis due to animal (cat) (dog) hair and dander: Secondary | ICD-10-CM | POA: Diagnosis not present

## 2024-03-13 DIAGNOSIS — T63461D Toxic effect of venom of wasps, accidental (unintentional), subsequent encounter: Secondary | ICD-10-CM | POA: Diagnosis not present

## 2024-03-13 DIAGNOSIS — J3089 Other allergic rhinitis: Secondary | ICD-10-CM | POA: Diagnosis not present

## 2024-03-13 DIAGNOSIS — J301 Allergic rhinitis due to pollen: Secondary | ICD-10-CM | POA: Diagnosis not present

## 2024-03-20 DIAGNOSIS — J3089 Other allergic rhinitis: Secondary | ICD-10-CM | POA: Diagnosis not present

## 2024-03-20 DIAGNOSIS — J301 Allergic rhinitis due to pollen: Secondary | ICD-10-CM | POA: Diagnosis not present

## 2024-03-20 DIAGNOSIS — J3081 Allergic rhinitis due to animal (cat) (dog) hair and dander: Secondary | ICD-10-CM | POA: Diagnosis not present

## 2024-04-19 DIAGNOSIS — T63441D Toxic effect of venom of bees, accidental (unintentional), subsequent encounter: Secondary | ICD-10-CM | POA: Diagnosis not present

## 2024-04-19 DIAGNOSIS — J3081 Allergic rhinitis due to animal (cat) (dog) hair and dander: Secondary | ICD-10-CM | POA: Diagnosis not present

## 2024-04-19 DIAGNOSIS — J3089 Other allergic rhinitis: Secondary | ICD-10-CM | POA: Diagnosis not present

## 2024-04-19 DIAGNOSIS — J301 Allergic rhinitis due to pollen: Secondary | ICD-10-CM | POA: Diagnosis not present

## 2024-04-24 DIAGNOSIS — J3089 Other allergic rhinitis: Secondary | ICD-10-CM | POA: Diagnosis not present

## 2024-04-24 DIAGNOSIS — J301 Allergic rhinitis due to pollen: Secondary | ICD-10-CM | POA: Diagnosis not present

## 2024-04-24 DIAGNOSIS — J3081 Allergic rhinitis due to animal (cat) (dog) hair and dander: Secondary | ICD-10-CM | POA: Diagnosis not present

## 2024-05-21 DIAGNOSIS — T63441D Toxic effect of venom of bees, accidental (unintentional), subsequent encounter: Secondary | ICD-10-CM | POA: Diagnosis not present

## 2024-05-21 DIAGNOSIS — Z4689 Encounter for fitting and adjustment of other specified devices: Secondary | ICD-10-CM | POA: Diagnosis not present

## 2024-05-21 DIAGNOSIS — T63461D Toxic effect of venom of wasps, accidental (unintentional), subsequent encounter: Secondary | ICD-10-CM | POA: Diagnosis not present

## 2024-05-21 DIAGNOSIS — T63451D Toxic effect of venom of hornets, accidental (unintentional), subsequent encounter: Secondary | ICD-10-CM | POA: Diagnosis not present

## 2024-05-21 DIAGNOSIS — N958 Other specified menopausal and perimenopausal disorders: Secondary | ICD-10-CM | POA: Diagnosis not present

## 2024-05-22 DIAGNOSIS — J3089 Other allergic rhinitis: Secondary | ICD-10-CM | POA: Diagnosis not present

## 2024-05-22 DIAGNOSIS — J301 Allergic rhinitis due to pollen: Secondary | ICD-10-CM | POA: Diagnosis not present

## 2024-05-28 DIAGNOSIS — J301 Allergic rhinitis due to pollen: Secondary | ICD-10-CM | POA: Diagnosis not present

## 2024-05-28 DIAGNOSIS — J3089 Other allergic rhinitis: Secondary | ICD-10-CM | POA: Diagnosis not present

## 2024-05-28 DIAGNOSIS — J3081 Allergic rhinitis due to animal (cat) (dog) hair and dander: Secondary | ICD-10-CM | POA: Diagnosis not present

## 2024-06-04 DIAGNOSIS — J3081 Allergic rhinitis due to animal (cat) (dog) hair and dander: Secondary | ICD-10-CM | POA: Diagnosis not present

## 2024-06-04 DIAGNOSIS — J301 Allergic rhinitis due to pollen: Secondary | ICD-10-CM | POA: Diagnosis not present

## 2024-06-04 DIAGNOSIS — J3089 Other allergic rhinitis: Secondary | ICD-10-CM | POA: Diagnosis not present

## 2024-06-10 DIAGNOSIS — J3081 Allergic rhinitis due to animal (cat) (dog) hair and dander: Secondary | ICD-10-CM | POA: Diagnosis not present

## 2024-06-10 DIAGNOSIS — J301 Allergic rhinitis due to pollen: Secondary | ICD-10-CM | POA: Diagnosis not present

## 2024-06-10 DIAGNOSIS — J3089 Other allergic rhinitis: Secondary | ICD-10-CM | POA: Diagnosis not present

## 2024-06-17 DIAGNOSIS — J3089 Other allergic rhinitis: Secondary | ICD-10-CM | POA: Diagnosis not present

## 2024-06-17 DIAGNOSIS — J301 Allergic rhinitis due to pollen: Secondary | ICD-10-CM | POA: Diagnosis not present

## 2024-06-17 DIAGNOSIS — J3081 Allergic rhinitis due to animal (cat) (dog) hair and dander: Secondary | ICD-10-CM | POA: Diagnosis not present

## 2024-06-24 DIAGNOSIS — J3081 Allergic rhinitis due to animal (cat) (dog) hair and dander: Secondary | ICD-10-CM | POA: Diagnosis not present

## 2024-06-24 DIAGNOSIS — J301 Allergic rhinitis due to pollen: Secondary | ICD-10-CM | POA: Diagnosis not present

## 2024-06-24 DIAGNOSIS — J3089 Other allergic rhinitis: Secondary | ICD-10-CM | POA: Diagnosis not present

## 2024-07-01 DIAGNOSIS — Z1231 Encounter for screening mammogram for malignant neoplasm of breast: Secondary | ICD-10-CM | POA: Diagnosis not present
